# Patient Record
Sex: Male | Born: 1990 | Race: Black or African American | Hispanic: No | Marital: Single | Smoking: Never smoker
Health system: Southern US, Community
[De-identification: ages and names within clinical notes are randomized; demographics above are authoritative.]

## PROBLEM LIST (undated history)

## (undated) HISTORY — PX: ORIF PELVIC FRACTURE: SUR948

---

## 2014-06-08 ENCOUNTER — Emergency Department (HOSPITAL_COMMUNITY)
Admission: EM | Admit: 2014-06-08 | Discharge: 2014-06-08 | Disposition: A | Discharge status: Home / Self Care | Payer: Self-pay | Attending: Emergency Medicine | Admitting: Emergency Medicine

## 2014-06-08 ENCOUNTER — Encounter (HOSPITAL_COMMUNITY): Payer: Self-pay | Admitting: Emergency Medicine

## 2014-06-08 ENCOUNTER — Emergency Department (HOSPITAL_COMMUNITY): Payer: Self-pay

## 2014-06-08 DIAGNOSIS — R209 Unspecified disturbances of skin sensation: Secondary | ICD-10-CM | POA: Insufficient documentation

## 2014-06-08 DIAGNOSIS — M5416 Radiculopathy, lumbar region: Secondary | ICD-10-CM

## 2014-06-08 DIAGNOSIS — F17200 Nicotine dependence, unspecified, uncomplicated: Secondary | ICD-10-CM | POA: Insufficient documentation

## 2014-06-08 DIAGNOSIS — Z87828 Personal history of other (healed) physical injury and trauma: Secondary | ICD-10-CM | POA: Insufficient documentation

## 2014-06-08 DIAGNOSIS — IMO0002 Reserved for concepts with insufficient information to code with codable children: Secondary | ICD-10-CM | POA: Insufficient documentation

## 2014-06-08 DIAGNOSIS — M549 Dorsalgia, unspecified: Secondary | ICD-10-CM | POA: Insufficient documentation

## 2014-06-08 MED ORDER — HYDROMORPHONE HCL 2 MG/ML IJ SOLN
2.0000 mg | Freq: Once | INTRAMUSCULAR | Status: AC
  Administered 2014-06-08: 2 mg via INTRAMUSCULAR
  Filled 2014-06-08: qty 1

## 2014-06-08 MED ORDER — HYDROCODONE-ACETAMINOPHEN 5-325 MG PO TABS: 2.0000 | ORAL_TABLET | ORAL | Status: DC | PRN

## 2014-06-08 MED ORDER — ONDANSETRON 8 MG PO TBDP
8.0000 mg | ORAL_TABLET | Freq: Once | ORAL | Status: AC
  Administered 2014-06-08: 8 mg via ORAL
  Filled 2014-06-08: qty 1

## 2014-06-08 MED ORDER — PREDNISONE 20 MG PO TABS: ORAL_TABLET | ORAL | Status: DC
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
## 2014-06-08 NOTE — Discharge Instructions (Signed)
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
## 2014-06-08 NOTE — ED Notes (Signed)
Patient transported to X-ray 

## 2020-04-01 ENCOUNTER — Other Ambulatory Visit: Payer: Self-pay

## 2020-04-01 ENCOUNTER — Inpatient Hospital Stay (HOSPITAL_COMMUNITY): Payer: PRIVATE HEALTH INSURANCE | Admitting: Anesthesiology

## 2020-04-01 ENCOUNTER — Inpatient Hospital Stay (HOSPITAL_COMMUNITY): Payer: PRIVATE HEALTH INSURANCE

## 2020-04-01 ENCOUNTER — Encounter (HOSPITAL_COMMUNITY): Admission: EM | Disposition: A | Discharge status: Rehabilitation Facility | Payer: Self-pay | Source: Home / Self Care

## 2020-04-01 ENCOUNTER — Emergency Department (HOSPITAL_COMMUNITY): Payer: PRIVATE HEALTH INSURANCE

## 2020-04-01 ENCOUNTER — Encounter (HOSPITAL_COMMUNITY): Payer: Self-pay | Admitting: Emergency Medicine

## 2020-04-01 ENCOUNTER — Inpatient Hospital Stay (HOSPITAL_COMMUNITY)
Admission: EM | Admit: 2020-04-01 | Discharge: 2020-04-07 | DRG: 908 | Disposition: A | Discharge status: Rehabilitation Facility | Payer: PRIVATE HEALTH INSURANCE | Attending: Surgery | Admitting: Surgery

## 2020-04-01 DIAGNOSIS — S32811D Multiple fractures of pelvis with unstable disruption of pelvic ring, subsequent encounter for fracture with routine healing: Secondary | ICD-10-CM | POA: Diagnosis not present

## 2020-04-01 DIAGNOSIS — M7989 Other specified soft tissue disorders: Secondary | ICD-10-CM | POA: Diagnosis not present

## 2020-04-01 DIAGNOSIS — F17210 Nicotine dependence, cigarettes, uncomplicated: Secondary | ICD-10-CM | POA: Diagnosis present

## 2020-04-01 DIAGNOSIS — R202 Paresthesia of skin: Secondary | ICD-10-CM | POA: Diagnosis present

## 2020-04-01 DIAGNOSIS — M25571 Pain in right ankle and joints of right foot: Secondary | ICD-10-CM | POA: Diagnosis present

## 2020-04-01 DIAGNOSIS — R7303 Prediabetes: Secondary | ICD-10-CM | POA: Diagnosis present

## 2020-04-01 DIAGNOSIS — E559 Vitamin D deficiency, unspecified: Secondary | ICD-10-CM | POA: Diagnosis present

## 2020-04-01 DIAGNOSIS — S32810A Multiple fractures of pelvis with stable disruption of pelvic ring, initial encounter for closed fracture: Secondary | ICD-10-CM

## 2020-04-01 DIAGNOSIS — Z23 Encounter for immunization: Secondary | ICD-10-CM

## 2020-04-01 DIAGNOSIS — T148XXA Other injury of unspecified body region, initial encounter: Secondary | ICD-10-CM

## 2020-04-01 DIAGNOSIS — S329XXA Fracture of unspecified parts of lumbosacral spine and pelvis, initial encounter for closed fracture: Secondary | ICD-10-CM | POA: Diagnosis present

## 2020-04-01 DIAGNOSIS — S32810S Multiple fractures of pelvis with stable disruption of pelvic ring, sequela: Secondary | ICD-10-CM | POA: Diagnosis not present

## 2020-04-01 DIAGNOSIS — Z20822 Contact with and (suspected) exposure to covid-19: Secondary | ICD-10-CM | POA: Diagnosis present

## 2020-04-01 DIAGNOSIS — E8889 Other specified metabolic disorders: Secondary | ICD-10-CM | POA: Diagnosis present

## 2020-04-01 DIAGNOSIS — S52371A Galeazzi's fracture of right radius, initial encounter for closed fracture: Secondary | ICD-10-CM | POA: Diagnosis present

## 2020-04-01 DIAGNOSIS — M21931 Unspecified acquired deformity of right forearm: Secondary | ICD-10-CM

## 2020-04-01 DIAGNOSIS — T1490XA Injury, unspecified, initial encounter: Secondary | ICD-10-CM | POA: Diagnosis present

## 2020-04-01 DIAGNOSIS — S52591A Other fractures of lower end of right radius, initial encounter for closed fracture: Secondary | ICD-10-CM | POA: Diagnosis present

## 2020-04-01 DIAGNOSIS — K4090 Unilateral inguinal hernia, without obstruction or gangrene, not specified as recurrent: Secondary | ICD-10-CM | POA: Diagnosis present

## 2020-04-01 DIAGNOSIS — E739 Lactose intolerance, unspecified: Secondary | ICD-10-CM | POA: Diagnosis present

## 2020-04-01 DIAGNOSIS — S32810D Multiple fractures of pelvis with stable disruption of pelvic ring, subsequent encounter for fracture with routine healing: Secondary | ICD-10-CM | POA: Diagnosis not present

## 2020-04-01 DIAGNOSIS — Z6841 Body Mass Index (BMI) 40.0 and over, adult: Secondary | ICD-10-CM | POA: Diagnosis not present

## 2020-04-01 DIAGNOSIS — R109 Unspecified abdominal pain: Secondary | ICD-10-CM | POA: Diagnosis present

## 2020-04-01 DIAGNOSIS — R52 Pain, unspecified: Secondary | ICD-10-CM

## 2020-04-01 DIAGNOSIS — T07XXXA Unspecified multiple injuries, initial encounter: Secondary | ICD-10-CM | POA: Diagnosis present

## 2020-04-01 DIAGNOSIS — R609 Edema, unspecified: Secondary | ICD-10-CM | POA: Diagnosis not present

## 2020-04-01 DIAGNOSIS — D62 Acute posthemorrhagic anemia: Secondary | ICD-10-CM | POA: Diagnosis present

## 2020-04-01 DIAGNOSIS — M25562 Pain in left knee: Secondary | ICD-10-CM | POA: Diagnosis present

## 2020-04-01 DIAGNOSIS — S62101A Fracture of unspecified carpal bone, right wrist, initial encounter for closed fracture: Secondary | ICD-10-CM

## 2020-04-01 DIAGNOSIS — Q899 Congenital malformation, unspecified: Secondary | ICD-10-CM | POA: Diagnosis not present

## 2020-04-01 DIAGNOSIS — R200 Anesthesia of skin: Secondary | ICD-10-CM | POA: Diagnosis present

## 2020-04-01 DIAGNOSIS — M542 Cervicalgia: Secondary | ICD-10-CM

## 2020-04-01 DIAGNOSIS — R739 Hyperglycemia, unspecified: Secondary | ICD-10-CM | POA: Diagnosis present

## 2020-04-01 DIAGNOSIS — Z419 Encounter for procedure for purposes other than remedying health state, unspecified: Secondary | ICD-10-CM

## 2020-04-01 HISTORY — PX: ORIF WRIST FRACTURE: SHX2133

## 2020-04-01 HISTORY — PX: ORIF PELVIC FRACTURE: SHX2128

## 2020-04-01 LAB — TYPE AND SCREEN
ABO/RH(D): A POS
Antibody Screen: NEGATIVE

## 2020-04-01 LAB — CBC WITH DIFFERENTIAL/PLATELET
Abs Immature Granulocytes: 0.26 10*3/uL — ABNORMAL HIGH (ref 0.00–0.07)
Basophils Absolute: 0 10*3/uL (ref 0.0–0.1)
Basophils Relative: 0 %
Eosinophils Absolute: 0.1 10*3/uL (ref 0.0–0.5)
Eosinophils Relative: 1 %
HCT: 42.9 % (ref 39.0–52.0)
Hemoglobin: 13 g/dL (ref 13.0–17.0)
Immature Granulocytes: 2 %
Lymphocytes Relative: 25 %
Lymphs Abs: 3.1 10*3/uL (ref 0.7–4.0)
MCH: 25.5 pg — ABNORMAL LOW (ref 26.0–34.0)
MCHC: 30.3 g/dL (ref 30.0–36.0)
MCV: 84.1 fL (ref 80.0–100.0)
Monocytes Absolute: 0.9 10*3/uL (ref 0.1–1.0)
Monocytes Relative: 7 %
Neutro Abs: 8.1 10*3/uL — ABNORMAL HIGH (ref 1.7–7.7)
Neutrophils Relative %: 65 %
Platelets: 258 10*3/uL (ref 150–400)
RBC: 5.1 MIL/uL (ref 4.22–5.81)
RDW: 14.8 % (ref 11.5–15.5)
WBC: 12.4 10*3/uL — ABNORMAL HIGH (ref 4.0–10.5)
nRBC: 0 % (ref 0.0–0.2)

## 2020-04-01 LAB — COMPREHENSIVE METABOLIC PANEL
ALT: 44 U/L (ref 0–44)
AST: 77 U/L — ABNORMAL HIGH (ref 15–41)
Albumin: 3.7 g/dL (ref 3.5–5.0)
Alkaline Phosphatase: 62 U/L (ref 38–126)
Anion gap: 10 (ref 5–15)
BUN: 7 mg/dL (ref 6–20)
CO2: 22 mmol/L (ref 22–32)
Calcium: 9.3 mg/dL (ref 8.9–10.3)
Chloride: 108 mmol/L (ref 98–111)
Creatinine, Ser: 1.06 mg/dL (ref 0.61–1.24)
GFR calc Af Amer: >60 mL/min (ref >60)
GFR calc non Af Amer: >60 mL/min (ref >60)
Glucose, Bld: 131 mg/dL — ABNORMAL HIGH (ref 70–99)
Potassium: 3.7 mmol/L (ref 3.5–5.1)
Sodium: 140 mmol/L (ref 135–145)
Total Bilirubin: 0.8 mg/dL (ref 0.3–1.2)
Total Protein: 7.3 g/dL (ref 6.5–8.1)

## 2020-04-01 LAB — MRSA PCR SCREENING: MRSA by PCR: NEGATIVE

## 2020-04-01 LAB — URINALYSIS, ROUTINE W REFLEX MICROSCOPIC
Bilirubin Urine: NEGATIVE
Glucose, UA: NEGATIVE mg/dL
Hgb urine dipstick: NEGATIVE
Ketones, ur: NEGATIVE mg/dL
Leukocytes,Ua: NEGATIVE
Nitrite: NEGATIVE
Protein, ur: 100 mg/dL — AB
Specific Gravity, Urine: 1.027 (ref 1.005–1.030)
pH: 6 (ref 5.0–8.0)

## 2020-04-01 LAB — I-STAT CHEM 8, ED
BUN: 9 mg/dL (ref 6–20)
Calcium, Ion: 1.23 mmol/L (ref 1.15–1.40)
Chloride: 107 mmol/L (ref 98–111)
Creatinine, Ser: 1 mg/dL (ref 0.61–1.24)
Glucose, Bld: 122 mg/dL — ABNORMAL HIGH (ref 70–99)
HCT: 40 % (ref 39.0–52.0)
Hemoglobin: 13.6 g/dL (ref 13.0–17.0)
Potassium: 3.8 mmol/L (ref 3.5–5.1)
Sodium: 142 mmol/L (ref 135–145)
TCO2: 22 mmol/L (ref 22–32)

## 2020-04-01 LAB — SARS CORONAVIRUS 2 BY RT PCR (HOSPITAL ORDER, PERFORMED IN ~~LOC~~ HOSPITAL LAB): SARS Coronavirus 2: NEGATIVE

## 2020-04-01 SURGERY — OPEN REDUCTION INTERNAL FIXATION (ORIF) WRIST FRACTURE
Anesthesia: General | Site: Wrist | Laterality: Right

## 2020-04-01 MED ORDER — PROPOFOL 10 MG/ML IV BOLUS
INTRAVENOUS | Status: DC | PRN
  Administered 2020-04-01: 300 mg via INTRAVENOUS
  Administered 2020-04-01: 50 mg via INTRAVENOUS

## 2020-04-01 MED ORDER — ONDANSETRON 4 MG PO TBDP: 4.0000 mg | ORAL_TABLET | Freq: Four times a day (QID) | ORAL | Status: DC | PRN

## 2020-04-01 MED ORDER — IOHEXOL 350 MG/ML SOLN
100.0000 mL | Freq: Once | INTRAVENOUS | Status: AC | PRN
  Administered 2020-04-01: 100 mL via INTRAVENOUS

## 2020-04-01 MED ORDER — CHLORHEXIDINE GLUCONATE CLOTH 2 % EX PADS
6.0000 | MEDICATED_PAD | Freq: Every day | CUTANEOUS | Status: DC
  Administered 2020-04-01 – 2020-04-06 (×6): 6 via TOPICAL

## 2020-04-01 MED ORDER — DIPHENHYDRAMINE HCL 12.5 MG/5ML PO ELIX
12.5000 mg | ORAL_SOLUTION | Freq: Four times a day (QID) | ORAL | Status: DC | PRN
  Filled 2020-04-01: qty 5

## 2020-04-01 MED ORDER — MIDAZOLAM HCL 5 MG/5ML IJ SOLN
INTRAMUSCULAR | Status: AC | PRN
  Administered 2020-04-01 (×2): 0.5 mg via INTRAVENOUS

## 2020-04-01 MED ORDER — SODIUM CHLORIDE 0.9 % IV BOLUS: 1000.0000 mL | Freq: Once | INTRAVENOUS | Status: DC

## 2020-04-01 MED ORDER — MIDAZOLAM HCL 5 MG/5ML IJ SOLN
INTRAMUSCULAR | Status: DC | PRN
  Administered 2020-04-01: 2 mg via INTRAVENOUS

## 2020-04-01 MED ORDER — SODIUM CHLORIDE 0.9% FLUSH: 9.0000 mL | INTRAVENOUS | Status: DC | PRN

## 2020-04-01 MED ORDER — VANCOMYCIN HCL 1000 MG IV SOLR
INTRAVENOUS | Status: DC | PRN
  Administered 2020-04-01: 1000 mg via TOPICAL

## 2020-04-01 MED ORDER — DIPHENHYDRAMINE HCL 50 MG/ML IJ SOLN: 12.5000 mg | Freq: Four times a day (QID) | INTRAMUSCULAR | Status: DC | PRN

## 2020-04-01 MED ORDER — ROCURONIUM BROMIDE 100 MG/10ML IV SOLN
INTRAVENOUS | Status: DC | PRN
  Administered 2020-04-01: 60 mg via INTRAVENOUS
  Administered 2020-04-01: 40 mg via INTRAVENOUS
  Administered 2020-04-01: 50 mg via INTRAVENOUS

## 2020-04-01 MED ORDER — MIDAZOLAM HCL 2 MG/2ML IJ SOLN
INTRAMUSCULAR | Status: AC
  Filled 2020-04-01: qty 2

## 2020-04-01 MED ORDER — MORPHINE SULFATE (PF) 2 MG/ML IV SOLN
2.0000 mg | INTRAVENOUS | Status: DC | PRN
  Administered 2020-04-01 – 2020-04-02 (×5): 4 mg via INTRAVENOUS
  Administered 2020-04-02: 2 mg via INTRAVENOUS
  Administered 2020-04-02 – 2020-04-03 (×4): 4 mg via INTRAVENOUS
  Filled 2020-04-01: qty 2
  Filled 2020-04-01: qty 1
  Filled 2020-04-01 (×8): qty 2

## 2020-04-01 MED ORDER — SODIUM CHLORIDE 0.9 % IV SOLN
INTRAVENOUS | Status: AC | PRN
  Administered 2020-04-01: 100 mL/h via INTRAVENOUS

## 2020-04-01 MED ORDER — STERILE WATER FOR IRRIGATION IR SOLN
Status: DC | PRN
  Administered 2020-04-01: 1000 mL

## 2020-04-01 MED ORDER — METOPROLOL TARTRATE 5 MG/5ML IV SOLN: 5.0000 mg | Freq: Four times a day (QID) | INTRAVENOUS | Status: DC | PRN

## 2020-04-01 MED ORDER — GLYCOPYRROLATE PF 0.2 MG/ML IJ SOSY
PREFILLED_SYRINGE | INTRAMUSCULAR | Status: AC
  Filled 2020-04-01: qty 1

## 2020-04-01 MED ORDER — VANCOMYCIN HCL 1000 MG IV SOLR
INTRAVENOUS | Status: AC
  Filled 2020-04-01: qty 1000

## 2020-04-01 MED ORDER — ACETAMINOPHEN 325 MG PO TABS
650.0000 mg | ORAL_TABLET | Freq: Four times a day (QID) | ORAL | Status: DC
  Administered 2020-04-02 – 2020-04-03 (×4): 650 mg via ORAL
  Filled 2020-04-01 (×4): qty 2

## 2020-04-01 MED ORDER — SODIUM CHLORIDE (PF) 0.9 % IJ SOLN
INTRAMUSCULAR | Status: AC
  Filled 2020-04-01: qty 20

## 2020-04-01 MED ORDER — HYDROMORPHONE HCL 1 MG/ML IJ SOLN
2.0000 mg | INTRAMUSCULAR | Status: AC
  Administered 2020-04-01: 2 mg via INTRAVENOUS
  Filled 2020-04-01: qty 2

## 2020-04-01 MED ORDER — NALOXONE HCL 0.4 MG/ML IJ SOLN: 0.4000 mg | INTRAMUSCULAR | Status: DC | PRN

## 2020-04-01 MED ORDER — SUFENTANIL CITRATE 50 MCG/ML IV SOLN
INTRAVENOUS | Status: DC | PRN
  Administered 2020-04-01 (×3): 10 ug via INTRAVENOUS
  Administered 2020-04-01: 20 ug via INTRAVENOUS
  Administered 2020-04-01: 10 ug via INTRAVENOUS
  Administered 2020-04-01: 20 ug via INTRAVENOUS
  Administered 2020-04-01 – 2020-04-02 (×4): 10 ug via INTRAVENOUS

## 2020-04-01 MED ORDER — ONDANSETRON HCL 4 MG/2ML IJ SOLN
4.0000 mg | Freq: Four times a day (QID) | INTRAMUSCULAR | Status: DC | PRN
  Filled 2020-04-01: qty 2

## 2020-04-01 MED ORDER — DEXTROSE 5 % IV SOLN
3.0000 g | Freq: Three times a day (TID) | INTRAVENOUS | Status: DC
  Administered 2020-04-01: 3 g via INTRAVENOUS
  Filled 2020-04-01 (×4): qty 3000

## 2020-04-01 MED ORDER — CEFAZOLIN SODIUM 1 G IJ SOLR
INTRAMUSCULAR | Status: AC
  Filled 2020-04-01: qty 30

## 2020-04-01 MED ORDER — LACTATED RINGERS IV SOLN: INTRAVENOUS | Status: DC | PRN

## 2020-04-01 MED ORDER — FENTANYL CITRATE (PF) 100 MCG/2ML IJ SOLN
INTRAMUSCULAR | Status: AC | PRN
  Administered 2020-04-01: 100 ug via INTRAVENOUS

## 2020-04-01 MED ORDER — GLYCOPYRROLATE 0.2 MG/ML IJ SOLN
INTRAMUSCULAR | Status: DC | PRN
  Administered 2020-04-01: .2 mg via INTRAVENOUS

## 2020-04-01 MED ORDER — SUCCINYLCHOLINE CHLORIDE 20 MG/ML IJ SOLN
INTRAMUSCULAR | Status: DC | PRN
  Administered 2020-04-01: 160 mg via INTRAVENOUS

## 2020-04-01 MED ORDER — BISACODYL 10 MG RE SUPP: 10.0000 mg | Freq: Every day | RECTAL | Status: DC | PRN

## 2020-04-01 MED ORDER — SUCCINYLCHOLINE CHLORIDE 200 MG/10ML IV SOSY
PREFILLED_SYRINGE | INTRAVENOUS | Status: AC
  Filled 2020-04-01: qty 10

## 2020-04-01 MED ORDER — ROCURONIUM BROMIDE 10 MG/ML (PF) SYRINGE
PREFILLED_SYRINGE | INTRAVENOUS | Status: AC
  Filled 2020-04-01: qty 20

## 2020-04-01 MED ORDER — PANTOPRAZOLE SODIUM 40 MG PO TBEC
40.0000 mg | DELAYED_RELEASE_TABLET | Freq: Every day | ORAL | Status: DC
  Administered 2020-04-02 – 2020-04-07 (×6): 40 mg via ORAL
  Filled 2020-04-01 (×6): qty 1

## 2020-04-01 MED ORDER — VANCOMYCIN HCL 500 MG IV SOLR
INTRAVENOUS | Status: AC
  Filled 2020-04-01: qty 500

## 2020-04-01 MED ORDER — ONDANSETRON HCL 4 MG/2ML IJ SOLN: 4.0000 mg | Freq: Once | INTRAMUSCULAR | Status: AC

## 2020-04-01 MED ORDER — 0.9 % SODIUM CHLORIDE (POUR BTL) OPTIME
TOPICAL | Status: DC | PRN
  Administered 2020-04-01 (×2): 1000 mL

## 2020-04-01 MED ORDER — PNEUMOCOCCAL VAC POLYVALENT 25 MCG/0.5ML IJ INJ: 0.5000 mL | INJECTION | INTRAMUSCULAR | Status: DC

## 2020-04-01 MED ORDER — SUFENTANIL CITRATE 50 MCG/ML IV SOLN
INTRAVENOUS | Status: AC
  Filled 2020-04-01: qty 1

## 2020-04-01 MED ORDER — LIDOCAINE HCL 1 % IJ SOLN
20.0000 mL | Freq: Once | INTRAMUSCULAR | Status: DC
  Filled 2020-04-01: qty 20

## 2020-04-01 MED ORDER — TETANUS-DIPHTH-ACELL PERTUSSIS 5-2.5-18.5 LF-MCG/0.5 IM SUSP
0.5000 mL | Freq: Once | INTRAMUSCULAR | Status: AC
  Administered 2020-04-01: 0.5 mL via INTRAMUSCULAR
  Filled 2020-04-01: qty 0.5

## 2020-04-01 MED ORDER — DEXAMETHASONE SODIUM PHOSPHATE 10 MG/ML IJ SOLN
INTRAMUSCULAR | Status: AC
  Filled 2020-04-01: qty 1

## 2020-04-01 MED ORDER — ONDANSETRON HCL 4 MG/2ML IJ SOLN
INTRAMUSCULAR | Status: AC
  Filled 2020-04-01: qty 2

## 2020-04-01 MED ORDER — LIDOCAINE HCL (CARDIAC) PF 100 MG/5ML IV SOSY
PREFILLED_SYRINGE | INTRAVENOUS | Status: DC | PRN
  Administered 2020-04-01: 100 mg via INTRATRACHEAL

## 2020-04-01 MED ORDER — MIDAZOLAM HCL 2 MG/2ML IJ SOLN: 0.5000 mg | Freq: Once | INTRAMUSCULAR | Status: DC

## 2020-04-01 MED ORDER — PANTOPRAZOLE SODIUM 40 MG IV SOLR: 40.0000 mg | Freq: Every day | INTRAVENOUS | Status: DC

## 2020-04-01 MED ORDER — PROPOFOL 10 MG/ML IV BOLUS
INTRAVENOUS | Status: AC
  Filled 2020-04-01: qty 20

## 2020-04-01 MED ORDER — DEXAMETHASONE SODIUM PHOSPHATE 10 MG/ML IJ SOLN
INTRAMUSCULAR | Status: DC | PRN
  Administered 2020-04-01: 10 mg via INTRAVENOUS

## 2020-04-01 MED ORDER — DEXTROSE 5 % IV SOLN
3.0000 g | Freq: Three times a day (TID) | INTRAVENOUS | Status: DC
  Filled 2020-04-01 (×3): qty 3000

## 2020-04-01 MED ORDER — DOCUSATE SODIUM 100 MG PO CAPS
100.0000 mg | ORAL_CAPSULE | Freq: Two times a day (BID) | ORAL | Status: DC
  Administered 2020-04-02 – 2020-04-07 (×11): 100 mg via ORAL
  Filled 2020-04-01 (×11): qty 1

## 2020-04-01 MED ORDER — OXYCODONE HCL 5 MG PO TABS
5.0000 mg | ORAL_TABLET | ORAL | Status: DC | PRN
  Administered 2020-04-02 – 2020-04-04 (×8): 10 mg via ORAL
  Administered 2020-04-04: 5 mg via ORAL
  Administered 2020-04-04 – 2020-04-07 (×9): 10 mg via ORAL
  Filled 2020-04-01 (×21): qty 2

## 2020-04-01 MED ORDER — HYDROMORPHONE 1 MG/ML IV SOLN: INTRAVENOUS | Status: DC

## 2020-04-01 MED ORDER — HYDROMORPHONE HCL 1 MG/ML IJ SOLN: 1.0000 mg | Freq: Once | INTRAMUSCULAR | Status: DC

## 2020-04-01 MED ORDER — HYDROMORPHONE HCL 1 MG/ML IJ SOLN
INTRAMUSCULAR | Status: AC
  Filled 2020-04-01: qty 1

## 2020-04-01 MED ORDER — ONDANSETRON HCL 4 MG/2ML IJ SOLN
INTRAMUSCULAR | Status: AC
  Administered 2020-04-01: 4 mg via INTRAVENOUS
  Filled 2020-04-01: qty 2

## 2020-04-01 MED ORDER — ACETAMINOPHEN 10 MG/ML IV SOLN
INTRAVENOUS | Status: DC | PRN
  Administered 2020-04-01: 1000 mg via INTRAVENOUS

## 2020-04-01 MED ORDER — FENTANYL CITRATE (PF) 100 MCG/2ML IJ SOLN
INTRAMUSCULAR | Status: AC
  Filled 2020-04-01: qty 2

## 2020-04-01 MED ORDER — HYDROMORPHONE HCL 1 MG/ML IJ SOLN
INTRAMUSCULAR | Status: AC | PRN
  Administered 2020-04-01 (×2): 1 mg via INTRAVENOUS

## 2020-04-01 MED ORDER — LACTATED RINGERS IV SOLN: INTRAVENOUS | Status: DC

## 2020-04-01 MED ORDER — CEFAZOLIN SODIUM-DEXTROSE 2-3 GM-%(50ML) IV SOLR
INTRAVENOUS | Status: DC | PRN
  Administered 2020-04-01: 3 g via INTRAVENOUS

## 2020-04-01 MED ORDER — ALBUMIN HUMAN 5 % IV SOLN: INTRAVENOUS | Status: DC | PRN

## 2020-04-01 SURGICAL SUPPLY — 129 items
BIT DRILL 2.0 LNG QUCK RELEASE (BIT) ×2 IMPLANT
BIT DRILL 2.8 QUICK RELEASE (BIT) ×2 IMPLANT
BIT DRILL 5.6 (BIT) ×2 IMPLANT
BIT DRILL AO MATTA 2.5MX230M (BIT) ×2 IMPLANT
BLADE CLIPPER SURG (BLADE) ×3 IMPLANT
BNDG CMPR 9X4 STRL LF SNTH (GAUZE/BANDAGES/DRESSINGS)
BNDG ELASTIC 3X5.8 VLCR STR LF (GAUZE/BANDAGES/DRESSINGS) ×3 IMPLANT
BNDG ELASTIC 4X5.8 VLCR STR LF (GAUZE/BANDAGES/DRESSINGS) ×6 IMPLANT
BNDG ESMARK 4X9 LF (GAUZE/BANDAGES/DRESSINGS) IMPLANT
BNDG GAUZE ELAST 4 BULKY (GAUZE/BANDAGES/DRESSINGS) ×3 IMPLANT
BNDG PLASTER X FAST 4X5 WHT LF (CAST SUPPLIES) ×9 IMPLANT
BNDG PLSTR 5X4 XFST ST WHT LF (CAST SUPPLIES) ×6
BRUSH SCRUB EZ PLAIN DRY (MISCELLANEOUS) ×6 IMPLANT
CANISTER SUCT 3000ML PPV (MISCELLANEOUS) ×3 IMPLANT
CANISTER WOUND CARE 500ML ATS (WOUND CARE) ×3 IMPLANT
CORD BIPOLAR FORCEPS 12FT (ELECTRODE) ×3 IMPLANT
COVER BACK TABLE 60X90IN (DRAPES) ×3 IMPLANT
COVER SURGICAL LIGHT HANDLE (MISCELLANEOUS) ×12 IMPLANT
COVER WAND RF STERILE (DRAPES) ×3 IMPLANT
DRAPE C-ARM 42X72 X-RAY (DRAPES) ×12 IMPLANT
DRAPE C-ARM MINI 42X72 WSTRAPS (DRAPES) ×3 IMPLANT
DRAPE C-ARMOR (DRAPES) ×3 IMPLANT
DRAPE HALF SHEET 40X57 (DRAPES) ×12 IMPLANT
DRAPE IMP U-DRAPE 54X76 (DRAPES) ×6 IMPLANT
DRAPE LAPAROSCOPIC ABDOMINAL (DRAPES) ×3 IMPLANT
DRAPE LAPAROTOMY 100X72X124 (DRAPES) IMPLANT
DRAPE LAPAROTOMY TRNSV 102X78 (DRAPES) IMPLANT
DRAPE OEC MINIVIEW 54X84 (DRAPES) IMPLANT
DRAPE ORTHO SPLIT 77X108 STRL (DRAPES) ×6
DRAPE SURG 17X23 STRL (DRAPES) ×6 IMPLANT
DRAPE SURG ORHT 6 SPLT 77X108 (DRAPES) ×4 IMPLANT
DRAPE U-SHAPE 47X51 STRL (DRAPES) ×3 IMPLANT
DRESSING PREVENA PLUS CUSTOM (GAUZE/BANDAGES/DRESSINGS) ×2 IMPLANT
DRILL 2.0 LNG QUICK RELEASE (BIT) ×3
DRILL 2.8 QUICK RELEASE (BIT) ×3
DRILL 5.6 (BIT) ×3
DRILL BIT AO MATTA 2.5MX230M (BIT) ×3
DRSG ADAPTIC 3X8 NADH LF (GAUZE/BANDAGES/DRESSINGS) ×3 IMPLANT
DRSG MEPILEX BORDER 4X4 (GAUZE/BANDAGES/DRESSINGS) ×3 IMPLANT
DRSG PREVENA PLUS CUSTOM (GAUZE/BANDAGES/DRESSINGS) ×3
ELECT BLADE 4.0 EZ CLEAN MEGAD (MISCELLANEOUS) ×6
ELECT BLADE 6.5 EXT (BLADE) ×3 IMPLANT
ELECT CAUTERY BLADE 6.4 (BLADE) ×3 IMPLANT
ELECT REM PT RETURN 9FT ADLT (ELECTROSURGICAL) ×3
ELECTRODE BLDE 4.0 EZ CLN MEGD (MISCELLANEOUS) ×4 IMPLANT
ELECTRODE REM PT RTRN 9FT ADLT (ELECTROSURGICAL) ×2 IMPLANT
EXTRACTOR EASY OUT 1.5 (BIT) ×3
EXTRACTOR SURG EASY OUT 1.5 (BIT) ×2 IMPLANT
GAUZE SPONGE 4X4 12PLY STRL (GAUZE/BANDAGES/DRESSINGS) ×3 IMPLANT
GLOVE BIO SURGEON STRL SZ7.5 (GLOVE) ×6 IMPLANT
GLOVE BIO SURGEON STRL SZ8 (GLOVE) ×9 IMPLANT
GLOVE BIOGEL PI IND STRL 7.5 (GLOVE) ×4 IMPLANT
GLOVE BIOGEL PI IND STRL 8 (GLOVE) ×6 IMPLANT
GLOVE BIOGEL PI INDICATOR 7.5 (GLOVE) ×2
GLOVE BIOGEL PI INDICATOR 8 (GLOVE) ×3
GLOVE SURG SS PI 6.5 STRL IVOR (GLOVE) ×18 IMPLANT
GLOVE SURG SS PI 8.0 STRL IVOR (GLOVE) ×3 IMPLANT
GOWN STRL REIN XL XLG (GOWN DISPOSABLE) ×6 IMPLANT
GOWN STRL REUS W/ TWL LRG LVL3 (GOWN DISPOSABLE) ×4 IMPLANT
GOWN STRL REUS W/ TWL XL LVL3 (GOWN DISPOSABLE) ×4 IMPLANT
GOWN STRL REUS W/TWL LRG LVL3 (GOWN DISPOSABLE) ×6
GOWN STRL REUS W/TWL XL LVL3 (GOWN DISPOSABLE) ×6
GUIDEWIRE ORTHO 0.054X6 (WIRE) ×3 IMPLANT
GUIDEWIRE THRD ASNIS 3.2X300 (WIRE) ×9 IMPLANT
KIT BASIN OR (CUSTOM PROCEDURE TRAY) ×9 IMPLANT
KIT DRSG PREVENA PLUS 7DAY 125 (MISCELLANEOUS) ×3 IMPLANT
KIT TURNOVER KIT B (KITS) ×3 IMPLANT
MARKER SKIN DUAL TIP RULER LAB (MISCELLANEOUS) ×6 IMPLANT
NS IRRIG 1000ML POUR BTL (IV SOLUTION) ×12 IMPLANT
PACK ORTHO EXTREMITY (CUSTOM PROCEDURE TRAY) ×6 IMPLANT
PAD ARMBOARD 7.5X6 YLW CONV (MISCELLANEOUS) ×9 IMPLANT
PAD CAST 3X4 CTTN HI CHSV (CAST SUPPLIES) ×4 IMPLANT
PAD CAST 4YDX4 CTTN HI CHSV (CAST SUPPLIES) ×4 IMPLANT
PADDING CAST COTTON 3X4 STRL (CAST SUPPLIES) ×6
PADDING CAST COTTON 4X4 STRL (CAST SUPPLIES) ×6
PLATE ACULOC 2 EXTENTION (Plate) ×3 IMPLANT
PLATE ACULOC 2 STANDARD LG R (Plate) ×3 IMPLANT
PLATE ACULOC 2 STD LG R (Plate) ×2 IMPLANT
PLATE PROXIMAL ACULOC 2 WD RT (Plate) ×3 IMPLANT
PLATE SYMPHYSIS 92.5M 6H (Plate) ×3 IMPLANT
RETRACTOR ONETRAX LX 135X30 (MISCELLANEOUS) ×3 IMPLANT
RETRACTOR YANK SUCT EIGR SABER (INSTRUMENTS) ×3 IMPLANT
SCREW BN FT 16X2.3XLCK HEX CRT (Screw) ×2 IMPLANT
SCREW CANN ASNIS 8X150MM (Screw) ×3 IMPLANT
SCREW CANN PT SD 8X175X25 (Screw) ×3 IMPLANT
SCREW CANN THRD 5.5MMX55MM (Screw) ×3 IMPLANT
SCREW CORT 48X3.5XST NS LF (Screw) ×2 IMPLANT
SCREW CORT FT 18X2.3XLCK HEX (Screw) ×6 IMPLANT
SCREW CORT FT 20X2.3XLCK HEX (Screw) ×4 IMPLANT
SCREW CORTEX ST MATTA 3.5X30MM (Screw) ×9 IMPLANT
SCREW CORTEX ST MATTA 3.5X34MM (Screw) ×6 IMPLANT
SCREW CORTEX ST MATTA 3.5X55MM (Screw) ×3 IMPLANT
SCREW CORTEX ST MATTA 3.5X75MM (Screw) ×3 IMPLANT
SCREW CORTICAL 3.5X48MM (Screw) ×3 IMPLANT
SCREW CORTICAL LOCKING 2.3X16M (Screw) ×3 IMPLANT
SCREW CORTICAL LOCKING 2.3X18M (Screw) ×9 IMPLANT
SCREW CORTICAL LOCKING 2.3X20M (Screw) ×6 IMPLANT
SCREW HEXALOBE LOCKING 3.5X14M (Screw) ×6 IMPLANT
SCREW HEXALOBE NON-LOCK 3.5X14 (Screw) ×9 IMPLANT
SCREW HEXALOBE NON-LOCK 3.5X16 (Screw) ×6 IMPLANT
SCREW VA LKG 2.3X16MM (Screw) ×3 IMPLANT
SCREW VA LKG 2.3X20MM (Screw) ×3 IMPLANT
SLING ARM FOAM STRAP XLG (SOFTGOODS) ×3 IMPLANT
SOL PREP POV-IOD 4OZ 10% (MISCELLANEOUS) ×6 IMPLANT
SOL PREP PROV IODINE SCRUB 4OZ (MISCELLANEOUS) ×3 IMPLANT
SOLUTION BETADINE 4OZ (MISCELLANEOUS) ×3 IMPLANT
SPONGE LAP 18X18 RF (DISPOSABLE) ×9 IMPLANT
SPONGE LAP 4X18 RFD (DISPOSABLE) ×3 IMPLANT
STAPLER VISISTAT 35W (STAPLE) ×6 IMPLANT
SUCTION FRAZIER HANDLE 10FR (MISCELLANEOUS)
SUCTION TUBE FRAZIER 10FR DISP (MISCELLANEOUS) IMPLANT
SUT ETHILON 2 0 PSLX (SUTURE) ×3 IMPLANT
SUT ETHILON 3 0 PS 1 (SUTURE) ×6 IMPLANT
SUT VIC AB 0 CT1 27 (SUTURE) ×3
SUT VIC AB 0 CT1 27XBRD ANBCTR (SUTURE) ×2 IMPLANT
SUT VIC AB 1 CT1 18XCR BRD 8 (SUTURE) IMPLANT
SUT VIC AB 1 CT1 36 (SUTURE) ×6 IMPLANT
SUT VIC AB 1 CT1 8-18 (SUTURE)
SUT VIC AB 2-0 CT1 27 (SUTURE) ×6
SUT VIC AB 2-0 CT1 TAPERPNT 27 (SUTURE) ×4 IMPLANT
TOWEL GREEN STERILE (TOWEL DISPOSABLE) ×9 IMPLANT
TOWEL GREEN STERILE FF (TOWEL DISPOSABLE) ×6 IMPLANT
TUBE CONNECTING 12X1/4 (SUCTIONS) ×6 IMPLANT
TUBE EVACUATION TLS (MISCELLANEOUS) ×3 IMPLANT
UNDERPAD 30X36 HEAVY ABSORB (UNDERPADS AND DIAPERS) ×3 IMPLANT
WASHER SCREW MATTA SS 13.0X1.5 (Washer) ×6 IMPLANT
WATER STERILE IRR 1000ML POUR (IV SOLUTION) ×6 IMPLANT
WIRE TACK PLATE PL-PTACK (Wire) ×3 IMPLANT
YANKAUER SUCT BULB TIP NO VENT (SUCTIONS) ×3 IMPLANT

## 2020-04-01 NOTE — Progress Notes (Signed)
Report called to short stay at this time.  Awaiting transport. Mother and fiance at bedside.
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
## 2020-04-01 NOTE — ED Notes (Signed)
Pelvic binder placed on Ed by Dr. Bedelia Person and ER staff.
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
## 2020-04-01 NOTE — ED Notes (Signed)
Pt release and transferred to CT.

## 2020-04-01 NOTE — Progress Notes (Signed)
CT c-spine reviewed and negative for fracture. Clinical exam performed to evaluate for ligamentous injury. C-spine evaluation performed. No midline pain or TTP. Patient able to turn head left and right without midline cervical pain. Neck extension performed without midline pain, but midline pain with flexion. C-spine replaced and flex/ex films ordered.   Diamantina Monks, MD General and Trauma Surgery [facility]

## 2020-04-01 NOTE — Anesthesia Procedure Notes (Signed)
Procedure Name: Intubation Date/Time: 04/01/2020 8:15 PM Performed by: Melina Schools, CRNA Pre-anesthesia Checklist: Patient identified, Emergency Drugs available, Suction available, Patient being monitored and Timeout performed Patient Re-evaluated:Patient Re-evaluated prior to induction Oxygen Delivery Method: Circle system utilized Preoxygenation: Pre-oxygenation with 100% oxygen Induction Type: IV induction, Rapid sequence and Cricoid Pressure applied Laryngoscope Size: Glidescope and 4 Grade View: Grade II Tube type: Oral Tube size: 8.0 mm Number of attempts: 1 Airway Equipment and Method: Stylet and Video-laryngoscopy Placement Confirmation: ETT inserted through vocal cords under direct vision,  positive ETCO2 and breath sounds checked- equal and bilateral Secured at: 25 cm Tube secured with: Tape Dental Injury: Teeth and Oropharynx as per pre-operative assessment
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
## 2020-04-01 NOTE — Progress Notes (Signed)
All belongings taken with family after admitted. Pt has no belongings at bedside at time of leaving for OR
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
## 2020-04-01 NOTE — Anesthesia Preprocedure Evaluation (Addendum)
Anesthesia Evaluation  Patient identified by MRN, date of birth, ID band Patient awake    Reviewed: Allergy & Precautions, NPO status , Patient's Chart, lab work & pertinent test results  History of Anesthesia Complications Negative for: history of anesthetic complications  Airway Mallampati: II  TM Distance: >3 FB Neck ROM: Limited    Dental  (+) Dental Advisory Given, Missing   Pulmonary Current SmokerPatient did not abstain from smoking.,   Possible OSA, untested    Pulmonary exam normal        Cardiovascular negative cardio ROS   Rhythm:Regular Rate:Tachycardia     Neuro/Psych  C-spine not cleared negative psych ROS   GI/Hepatic negative GI ROS, Neg liver ROS,   Endo/Other  Morbid obesity  Renal/GU negative Renal ROS     Musculoskeletal negative musculoskeletal ROS (+)   Abdominal   Peds  Hematology negative hematology ROS (+)   Anesthesia Other Findings Covid test negative   Reproductive/Obstetrics                            Anesthesia Physical Anesthesia Plan  ASA: III  Anesthesia Plan: General   Post-op Pain Management:    Induction: Intravenous  PONV Risk Score and Plan: 3 and Treatment may vary due to age or medical condition, Ondansetron, Dexamethasone and Midazolam  Airway Management Planned: Oral ETT and Video Laryngoscope Planned  Additional Equipment: None  Intra-op Plan:   Post-operative Plan: Extubation in OR  Informed Consent: I have reviewed the patients History and Physical, chart, labs and discussed the procedure including the risks, benefits and alternatives for the proposed anesthesia with the patient or authorized representative who has indicated his/her understanding and acceptance.     Dental advisory given  Plan Discussed with: CRNA and Anesthesiologist  Anesthesia Plan Comments:        Anesthesia Quick Evaluation
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
## 2020-04-01 NOTE — ED Notes (Signed)
200 mcg Fentanyl given by EMS pta to ED.
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
## 2020-04-01 NOTE — ED Notes (Signed)
Family notified

## 2020-04-01 NOTE — ED Notes (Addendum)
Delay on ct transfer due To Dr. Reuel Boom assessment.

## 2020-04-02 ENCOUNTER — Inpatient Hospital Stay (HOSPITAL_COMMUNITY): Payer: PRIVATE HEALTH INSURANCE

## 2020-04-02 LAB — COMPREHENSIVE METABOLIC PANEL
ALT: 44 U/L (ref 0–44)
AST: 89 U/L — ABNORMAL HIGH (ref 15–41)
Albumin: 3.2 g/dL — ABNORMAL LOW (ref 3.5–5.0)
Alkaline Phosphatase: 51 U/L (ref 38–126)
Anion gap: 9 (ref 5–15)
BUN: 7 mg/dL (ref 6–20)
CO2: 22 mmol/L (ref 22–32)
Calcium: 8.6 mg/dL — ABNORMAL LOW (ref 8.9–10.3)
Chloride: 105 mmol/L (ref 98–111)
Creatinine, Ser: 1.07 mg/dL (ref 0.61–1.24)
GFR calc Af Amer: >60 mL/min (ref >60)
GFR calc non Af Amer: >60 mL/min (ref >60)
Glucose, Bld: 205 mg/dL — ABNORMAL HIGH (ref 70–99)
Potassium: 4 mmol/L (ref 3.5–5.1)
Sodium: 136 mmol/L (ref 135–145)
Total Bilirubin: 1 mg/dL (ref 0.3–1.2)
Total Protein: 6 g/dL — ABNORMAL LOW (ref 6.5–8.1)

## 2020-04-02 LAB — CBC
HCT: 35.6 % — ABNORMAL LOW (ref 39.0–52.0)
Hemoglobin: 11.2 g/dL — ABNORMAL LOW (ref 13.0–17.0)
MCH: 25.9 pg — ABNORMAL LOW (ref 26.0–34.0)
MCHC: 31.5 g/dL (ref 30.0–36.0)
MCV: 82.4 fL (ref 80.0–100.0)
Platelets: 201 10*3/uL (ref 150–400)
RBC: 4.32 MIL/uL (ref 4.22–5.81)
RDW: 14.9 % (ref 11.5–15.5)
WBC: 14.3 10*3/uL — ABNORMAL HIGH (ref 4.0–10.5)
nRBC: 0 % (ref 0.0–0.2)

## 2020-04-02 LAB — VITAMIN D 25 HYDROXY (VIT D DEFICIENCY, FRACTURES): Vit D, 25-Hydroxy: 10.27 ng/mL — ABNORMAL LOW (ref 30–100)

## 2020-04-02 LAB — ABO/RH: ABO/RH(D): A POS

## 2020-04-02 LAB — HIV ANTIBODY (ROUTINE TESTING W REFLEX): HIV Screen 4th Generation wRfx: NONREACTIVE

## 2020-04-02 MED ORDER — OXYCODONE HCL 5 MG PO TABS: 5.0000 mg | ORAL_TABLET | Freq: Once | ORAL | Status: DC | PRN

## 2020-04-02 MED ORDER — DEXMEDETOMIDINE (PRECEDEX) IN NS 20 MCG/5ML (4 MCG/ML) IV SYRINGE
PREFILLED_SYRINGE | INTRAVENOUS | Status: DC | PRN
  Administered 2020-04-02 (×5): 4 ug via INTRAVENOUS

## 2020-04-02 MED ORDER — VITAMIN D 25 MCG (1000 UNIT) PO TABS
2000.0000 [IU] | ORAL_TABLET | Freq: Two times a day (BID) | ORAL | Status: DC
  Administered 2020-04-02 – 2020-04-07 (×11): 2000 [IU] via ORAL
  Filled 2020-04-02 (×11): qty 2

## 2020-04-02 MED ORDER — PROMETHAZINE HCL 25 MG/ML IJ SOLN: 6.2500 mg | INTRAMUSCULAR | Status: DC | PRN

## 2020-04-02 MED ORDER — FENTANYL CITRATE (PF) 100 MCG/2ML IJ SOLN
25.0000 ug | INTRAMUSCULAR | Status: DC | PRN
  Administered 2020-04-02 (×2): 25 ug via INTRAVENOUS
  Administered 2020-04-02: 50 ug via INTRAVENOUS

## 2020-04-02 MED ORDER — ASCORBIC ACID 500 MG PO TABS
1000.0000 mg | ORAL_TABLET | Freq: Every day | ORAL | Status: DC
  Administered 2020-04-02 – 2020-04-07 (×6): 1000 mg via ORAL
  Filled 2020-04-02 (×6): qty 2

## 2020-04-02 MED ORDER — CEFAZOLIN SODIUM-DEXTROSE 2-4 GM/100ML-% IV SOLN
2.0000 g | Freq: Three times a day (TID) | INTRAVENOUS | Status: AC
  Administered 2020-04-02 (×3): 2 g via INTRAVENOUS
  Filled 2020-04-02 (×3): qty 100

## 2020-04-02 MED ORDER — ONDANSETRON HCL 4 MG/2ML IJ SOLN
INTRAMUSCULAR | Status: DC | PRN
  Administered 2020-04-02: 4 mg via INTRAVENOUS

## 2020-04-02 MED ORDER — ENOXAPARIN SODIUM 80 MG/0.8ML ~~LOC~~ SOLN
80.0000 mg | SUBCUTANEOUS | Status: DC
  Administered 2020-04-02 – 2020-04-03 (×2): 80 mg via SUBCUTANEOUS
  Filled 2020-04-02 (×2): qty 0.8

## 2020-04-02 MED ORDER — OXYCODONE HCL 5 MG/5ML PO SOLN: 5.0000 mg | Freq: Once | ORAL | Status: DC | PRN

## 2020-04-02 MED ORDER — SODIUM CHLORIDE (PF) 0.9 % IJ SOLN
INTRAMUSCULAR | Status: AC
  Filled 2020-04-02: qty 10

## 2020-04-02 MED ORDER — DEXMEDETOMIDINE (PRECEDEX) IN NS 20 MCG/5ML (4 MCG/ML) IV SYRINGE
PREFILLED_SYRINGE | INTRAVENOUS | Status: AC
  Filled 2020-04-02: qty 5

## 2020-04-02 MED ORDER — METHOCARBAMOL 1000 MG/10ML IJ SOLN
500.0000 mg | Freq: Three times a day (TID) | INTRAVENOUS | Status: DC
  Administered 2020-04-02 (×2): 500 mg via INTRAVENOUS
  Filled 2020-04-02 (×7): qty 5

## 2020-04-02 MED ORDER — VITAMIN D (ERGOCALCIFEROL) 1.25 MG (50000 UNIT) PO CAPS
50000.0000 [IU] | ORAL_CAPSULE | ORAL | Status: DC
  Administered 2020-04-02: 50000 [IU] via ORAL
  Filled 2020-04-02: qty 1

## 2020-04-02 MED ORDER — FENTANYL CITRATE (PF) 100 MCG/2ML IJ SOLN
INTRAMUSCULAR | Status: AC
  Filled 2020-04-02: qty 2

## 2020-04-02 MED ORDER — ADULT MULTIVITAMIN W/MINERALS CH
1.0000 | ORAL_TABLET | Freq: Every day | ORAL | Status: DC
  Administered 2020-04-02 – 2020-04-07 (×6): 1 via ORAL
  Filled 2020-04-02 (×6): qty 1

## 2020-04-02 MED ORDER — SUGAMMADEX SODIUM 200 MG/2ML IV SOLN
INTRAVENOUS | Status: DC | PRN
  Administered 2020-04-02: 200 mg via INTRAVENOUS

## 2020-04-02 MED ORDER — SUFENTANIL CITRATE 50 MCG/ML IV SOLN
INTRAVENOUS | Status: AC
  Filled 2020-04-02: qty 1

## 2020-04-02 NOTE — Transfer of Care (Signed)
Immediate Anesthesia Transfer of Care Note  Patient: Nicholas Randall  Procedure(s) Performed: ORIF PELVIS (N/A Pelvis) OPEN REDUCTION INTERNAL FIXATION (ORIF) WRIST FRACTURE (Right Wrist)  Patient Location: PACU  Anesthesia Type:General  Level of Consciousness: sedated, drowsy, patient cooperative and responds to stimulation  Airway & Oxygen Therapy: Patient Spontanous Breathing and Patient connected to nasal cannula oxygen  Post-op Assessment: Report given to RN, Post -op Vital signs reviewed and stable and Patient moving all extremities X 4  Post vital signs: Reviewed and stable  Last Vitals:  Vitals Value Taken Time  BP 166/114 04/02/20 0315  Temp 36.7 C 04/02/20 0314  Pulse 116 04/02/20 0317  Resp 25 04/02/20 0318  SpO2 77 % 04/02/20 0317  Vitals shown include unvalidated device data.  Last Pain:  Vitals:   04/01/20 1730  TempSrc:   PainSc: 10-Worst pain ever         Complications: No complications documented.
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
## 2020-04-02 NOTE — Plan of Care (Signed)
  Problem: Clinical Measurements: Goal: Ability to maintain clinical measurements within normal limits will improve Outcome: Progressing Goal: Will remain free from infection Outcome: Progressing Goal: Diagnostic test results will improve Outcome: Progressing Goal: Respiratory complications will improve Outcome: Progressing Goal: Cardiovascular complication will be avoided Outcome: Progressing   Problem: Activity: Goal: Risk for activity intolerance will decrease Outcome: Progressing   Problem: Nutrition: Goal: Adequate nutrition will be maintained Outcome: Progressing   Problem: Coping: Goal: Level of anxiety will decrease Outcome: Progressing   Problem: Pain Managment: Goal: General experience of comfort will improve Outcome: Progressing   Problem: Safety: Goal: Ability to remain free from injury will improve Outcome: Progressing   Problem: Skin Integrity: Goal: Risk for impaired skin integrity will decrease Outcome: Progressing   Problem: Skin Integrity: Goal: Demonstration of wound healing without infection will improve Outcome: Progressing
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
## 2020-04-02 NOTE — Progress Notes (Signed)
Called PA Montez Morita with ortho for patient's increasing progressively worsening pain in right wrist with pressure that he describes as "tight," despite scheduled and PRN pain medication. ROM is intact, no changes in neurovascular assessment to right upper extremity, warm, dry.  Per PA Renae Fickle, unwrap/ loosen ace bandage, if needed, can split the cast gauze under ace wrap if necessary, and continue to ice and elevate extremity. Will continue to monitor.

## 2020-04-02 NOTE — Anesthesia Postprocedure Evaluation (Signed)
Anesthesia Post Note  Patient: Nicholas Randall  Procedure(s) Performed: ORIF PELVIS (N/A Pelvis) OPEN REDUCTION INTERNAL FIXATION (ORIF) WRIST FRACTURE (Right Wrist)     Patient location during evaluation: PACU Anesthesia Type: General Level of consciousness: awake and alert Pain management: pain level controlled Vital Signs Assessment: post-procedure vital signs reviewed and stable Respiratory status: spontaneous breathing, nonlabored ventilation, respiratory function stable and patient connected to nasal cannula oxygen Cardiovascular status: blood pressure returned to baseline and stable Postop Assessment: no apparent nausea or vomiting Anesthetic complications: no   No complications documented.  Last Vitals:  Vitals:   04/02/20 0600 04/02/20 0700  BP: 118/68 115/62  Pulse: (!) 101 100  Resp: (!) 30 (!) 29  Temp:    SpO2: 98% 98%                  Beryle Lathe

## 2020-04-02 NOTE — Progress Notes (Signed)
Rehab Admissions Coordinator Note:  Patient was screened by Clois Dupes for appropriateness for an Inpatient Acute Rehab Consult per therapy recommendations. .  At this time, we are recommending Inpatient Rehab consult if patient fails to progress with therapy once pain lessens. Please advise.Clois Dupes RN MSN 04/02/2020, 2:32 PM  I can be reached at [phone].
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
## 2020-04-02 NOTE — TOC CAGE-AID Note (Signed)
Transition of Care [facility]) - CAGE-AID Screening   Patient Details  Name: Nicholas Randall MRN: [id number] Date of Birth: [birth year]  Transition of Care [facility]) CM/SW Contact:    Emeterio Reeve, [place] Phone Number: 04/02/2020, 12:51 PM   Clinical Narrative:  CSW met with pt at bedside. CSW introduced self and explained her role at the hospital.  Pt repots occasional social alcohol use. Pt denies substance use. Pt did not need resources at this time.   CAGE-AID Screening:    Have You Ever Felt You Ought to Cut Down on Your Drinking or Drug Use?: No Have People Annoyed You By Critizing Your Drinking Or Drug Use?: No Have You Felt Bad Or Guilty About Your Drinking Or Drug Use?: No Have You Ever Had a Drink or Used Drugs First Thing In The Morning to Steady Your Nerves or to Get Rid of a Hangover?: No CAGE-AID Score: 0  Substance Abuse Education Offered: Yes  Substance abuse interventions: Patient Counseling  Emeterio Reeve, Latanya Presser, [place] Social Worker [phone]
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
## 2020-04-03 ENCOUNTER — Encounter (HOSPITAL_COMMUNITY): Payer: Self-pay

## 2020-04-03 LAB — GLUCOSE, CAPILLARY
Glucose-Capillary: 111 mg/dL — ABNORMAL HIGH (ref 70–99)
Glucose-Capillary: 131 mg/dL — ABNORMAL HIGH (ref 70–99)
Glucose-Capillary: 102 mg/dL — ABNORMAL HIGH (ref 70–99)

## 2020-04-03 LAB — CBC
HCT: 31.6 % — ABNORMAL LOW (ref 39.0–52.0)
Hemoglobin: 10.2 g/dL — ABNORMAL LOW (ref 13.0–17.0)
MCH: 26.7 pg (ref 26.0–34.0)
MCHC: 32.3 g/dL (ref 30.0–36.0)
MCV: 82.7 fL (ref 80.0–100.0)
Platelets: 182 10*3/uL (ref 150–400)
RBC: 3.82 MIL/uL — ABNORMAL LOW (ref 4.22–5.81)
RDW: 15.1 % (ref 11.5–15.5)
WBC: 11.1 10*3/uL — ABNORMAL HIGH (ref 4.0–10.5)
nRBC: 0 % (ref 0.0–0.2)

## 2020-04-03 LAB — HEMOGLOBIN A1C
Hgb A1c MFr Bld: 5.8 % — ABNORMAL HIGH (ref 4.8–5.6)
Mean Plasma Glucose: 119.76 mg/dL

## 2020-04-03 MED ORDER — MORPHINE SULFATE (PF) 2 MG/ML IV SOLN
2.0000 mg | INTRAVENOUS | Status: DC | PRN
  Administered 2020-04-03 – 2020-04-04 (×2): 2 mg via INTRAVENOUS
  Filled 2020-04-03 (×2): qty 1

## 2020-04-03 MED ORDER — MORPHINE SULFATE (PF) 2 MG/ML IV SOLN
2.0000 mg | INTRAVENOUS | Status: DC | PRN
  Administered 2020-04-03 (×2): 2 mg via INTRAVENOUS
  Filled 2020-04-03 (×2): qty 1

## 2020-04-03 MED ORDER — GABAPENTIN 300 MG PO CAPS
300.0000 mg | ORAL_CAPSULE | Freq: Three times a day (TID) | ORAL | Status: DC
  Administered 2020-04-03 – 2020-04-07 (×13): 300 mg via ORAL
  Filled 2020-04-03 (×13): qty 1

## 2020-04-03 MED ORDER — ACETAMINOPHEN 500 MG PO TABS
1000.0000 mg | ORAL_TABLET | Freq: Four times a day (QID) | ORAL | Status: DC
  Administered 2020-04-03 – 2020-04-07 (×15): 1000 mg via ORAL
  Filled 2020-04-03 (×18): qty 2

## 2020-04-03 MED ORDER — INSULIN ASPART 100 UNIT/ML ~~LOC~~ SOLN: 0.0000 [IU] | Freq: Three times a day (TID) | SUBCUTANEOUS | Status: DC

## 2020-04-03 MED ORDER — INSULIN ASPART 100 UNIT/ML ~~LOC~~ SOLN
0.0000 [IU] | Freq: Three times a day (TID) | SUBCUTANEOUS | Status: DC
  Administered 2020-04-03: 2 [IU] via SUBCUTANEOUS

## 2020-04-03 MED ORDER — TRAMADOL HCL 50 MG PO TABS
50.0000 mg | ORAL_TABLET | Freq: Four times a day (QID) | ORAL | Status: DC
  Administered 2020-04-03 – 2020-04-07 (×18): 50 mg via ORAL
  Filled 2020-04-03 (×18): qty 1

## 2020-04-03 MED ORDER — METHOCARBAMOL 500 MG PO TABS
1000.0000 mg | ORAL_TABLET | Freq: Three times a day (TID) | ORAL | Status: DC
  Administered 2020-04-03 – 2020-04-07 (×14): 1000 mg via ORAL
  Filled 2020-04-03 (×14): qty 2

## 2020-04-03 MED ORDER — POLYETHYLENE GLYCOL 3350 17 G PO PACK
17.0000 g | PACK | Freq: Every day | ORAL | Status: DC
  Administered 2020-04-03 – 2020-04-07 (×6): 17 g via ORAL
  Filled 2020-04-03 (×5): qty 1

## 2020-04-03 NOTE — Plan of Care (Signed)
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
## 2020-04-03 NOTE — Progress Notes (Signed)
Pt arrived to unit via bed, transported via bed by ICU RN.  Pt transferred to bed, VS stable, reports pain 8/10.  Wound vac in place and operational.  Foley in place, foley care completed.  Pt oriented to unit and room.  Assisted with positioning.  He denies any other needs at this time.

## 2020-04-03 NOTE — Progress Notes (Signed)
Inpatient Rehab Admissions:  Inpatient Rehab Consult received.  I met with patient and his fiancee at the bedside for rehabilitation assessment and to discuss goals and expectations of an inpatient rehab admission.  Pt is open to CIR program when medically ready and bed available.  Plan to stay with fiancee at discharge and will have needed support.  Pt is an excellent candidate.  Will need insurance auth prior to potential admission.  Will continue to follow.   Signed: Shann Medal, PT, DPT Admissions Coordinator [phone] 04/03/20  3:36 PM
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
## 2020-04-04 LAB — CBC
HCT: 32.8 % — ABNORMAL LOW (ref 39.0–52.0)
Hemoglobin: 10.5 g/dL — ABNORMAL LOW (ref 13.0–17.0)
MCH: 26.4 pg (ref 26.0–34.0)
MCHC: 32 g/dL (ref 30.0–36.0)
MCV: 82.6 fL (ref 80.0–100.0)
Platelets: 182 10*3/uL (ref 150–400)
RBC: 3.97 MIL/uL — ABNORMAL LOW (ref 4.22–5.81)
RDW: 14.9 % (ref 11.5–15.5)
WBC: 11.6 10*3/uL — ABNORMAL HIGH (ref 4.0–10.5)
nRBC: 0 % (ref 0.0–0.2)

## 2020-04-04 LAB — GLUCOSE, CAPILLARY
Glucose-Capillary: 127 mg/dL — ABNORMAL HIGH (ref 70–99)
Glucose-Capillary: 113 mg/dL — ABNORMAL HIGH (ref 70–99)
Glucose-Capillary: 133 mg/dL — ABNORMAL HIGH (ref 70–99)

## 2020-04-04 MED ORDER — APIXABAN 2.5 MG PO TABS
2.5000 mg | ORAL_TABLET | Freq: Two times a day (BID) | ORAL | Status: DC
  Administered 2020-04-04 – 2020-04-07 (×7): 2.5 mg via ORAL
  Filled 2020-04-04 (×7): qty 1
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
## 2020-04-04 NOTE — Plan of Care (Signed)
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
## 2020-04-04 NOTE — Discharge Instructions (Addendum)
Please remain non-weight bearing to the right lower extremity. You are weight bearing as tolerating for transfers only  Please remain non-weightbearing throughout the wrist on the right  Follow up with orthopedics    Cast or Splint Care, Adult Casts and splints are supports that are worn to protect broken bones and other injuries. A cast or splint may hold a bone still and in the correct position while it heals. Casts and splints may also help ease pain, swelling, and muscle spasms. A cast is a hardened support that is usually made of fiberglass or plaster. It is custom-fit to the body and it offers more protection than a splint. It cannot be taken off and put back on. A splint is a type of soft support that is usually made from cloth and elastic. It can be adjusted or taken off as needed. You may need a cast or a splint if you:  Have a broken bone.  Have a soft-tissue injury.  Need to keep an injured body part from moving (keep it immobile) after surgery. How is this treated? If you have a cast:   Do not stick anything inside the cast to scratch your skin. Sticking something in the cast increases your risk of infection.  Check the skin around the cast every day. Tell your health care provider about any concerns.  You may put lotion on dry skin around the edges of the cast. Do not put lotion on the skin underneath the cast.  Keep the cast clean.  If the cast is not waterproof: ? Do not let it get wet. ? Cover it with a watertight covering when you take a bath or a shower. If you have a splint:   Wear it as told by your health care provider. Remove it only as told by your health care provider.  Loosen the splint if your fingers or toes tingle, become numb, or turn cold and blue.  Keep the splint clean.  If the splint is not waterproof: ? Do not let it get wet. ? Cover it with a watertight covering when you take a bath or a shower. Bathing  Do not take baths or swim until  your health care provider approves. Ask your health care provider if you can take showers. You may only be allowed to take sponge baths for bathing.  If your cast or splint is not waterproof, cover it with a watertight covering when you take a bath or shower. Managing pain, stiffness, and swelling  Move your fingers or toes often to avoid stiffness and to lessen swelling.  Raise (elevate) the injured area above the level of your heart while sitting or lying down. Safety  Do not use the injured limb to support your body weight until your health care provider says that it is okay.  Use crutches or other assistive devices as told by your health care provider. General instructions  Do not put pressure on any part of the cast or splint until it is fully hardened. This may take several hours.  Return to your normal activities as told by your health care provider. Ask your health care provider what activities are safe for you.  Take over-the-counter and prescription medicines only as told by your health care provider.  Keep all follow-up visits as told by your health care provider. This is important. Contact a health care provider if:  Your cast or splint gets damaged.  The skin around the cast gets red or raw.  The  skin under the cast is extremely itchy or painful.  Your cast or splint feels very uncomfortable.  Your cast or splint is too tight or too loose.  Your cast becomes wet or it develops a soft spot or area.  You get an object stuck under your cast. Get help right away if:  Your pain is getting worse.  The injured area tingles, becomes numb, or turns cold and blue.  The part of your body above or below the cast is swollen and discolored.  You cannot feel or move your fingers or toes.  There is fluid leaking through the cast.  You have severe pain or pressure under the cast.  You have trouble breathing.  You have shortness of breath.  You have chest pain. This  information is not intended to replace advice given to you by your health care provider. Make sure you discuss any questions you have with your health care provider. Document Revised: 06/27/2017 Document Reviewed: 02/21/2016 Elsevier Patient Education  2020 [employer].    Information on my medicine - ELIQUIS (apixaban)  This medication education was reviewed with me or my healthcare representative as part of my discharge preparation.     Why was Eliquis prescribed for you? Eliquis was prescribed for you to reduce the risk of blood clots forming after orthopedic surgery.    What do You need to know about Eliquis? Take your Eliquis TWICE DAILY - one tablet in the morning and one tablet in the evening with or without food.  It would be best to take the dose about the same time each day.  If you have difficulty swallowing the tablet whole please discuss with your pharmacist how to take the medication safely.  Take Eliquis exactly as prescribed by your doctor and DO NOT stop taking Eliquis without talking to the doctor who prescribed the medication.  Stopping without other medication to take the place of Eliquis may increase your risk of developing a clot.  After discharge, you should have regular check-up appointments with your healthcare provider that is prescribing your Eliquis.  What do you do if you miss a dose? If a dose of ELIQUIS is not taken at the scheduled time, take it as soon as possible on the same day and twice-daily administration should be resumed.  The dose should not be doubled to make up for a missed dose.  Do not take more than one tablet of ELIQUIS at the same time.  Important Safety Information A possible side effect of Eliquis is bleeding. You should call your healthcare provider right away if you experience any of the following: ? Bleeding from an injury or your nose that does not stop. ? Unusual colored urine (red or dark brown) or unusual colored stools  (red or black). ? Unusual bruising for unknown reasons. ? A serious fall or if you hit your head (even if there is no bleeding).  Some medicines may interact with Eliquis and might increase your risk of bleeding or clotting while on Eliquis. To help avoid this, consult your healthcare provider or pharmacist prior to using any new prescription or non-prescription medications, including herbals, vitamins, non-steroidal anti-inflammatory drugs (NSAIDs) and supplements.  This website has more information on Eliquis (apixaban): http://www.eliquis.com/eliquis/home   Preventing Type 2 Diabetes Mellitus Type 2 diabetes (type 2 diabetes mellitus) is a long-term (chronic) disease that affects blood sugar (glucose) levels. Normally, a hormone called insulin allows glucose to enter cells in the body. The cells use glucose for  energy. In type 2 diabetes, one or both of these problems may be present:  The body does not make enough insulin.  The body does not respond properly to insulin that it makes (insulin resistance). Insulin resistance or lack of insulin causes excess glucose to build up in the blood instead of going into cells. As a result, high blood glucose (hyperglycemia) develops, which can cause many complications. Being overweight or obese and having an inactive (sedentary) lifestyle can increase your risk for diabetes. Type 2 diabetes can be delayed or prevented by making certain nutrition and lifestyle changes. What nutrition changes can be made?   Eat healthy meals and snacks regularly. Keep a healthy snack with you for when you get hungry between meals, such as fruit or a handful of nuts.  Eat lean meats and proteins that are low in saturated fats, such as chicken, fish, egg whites, and beans. Avoid processed meats.  Eat plenty of fruits and vegetables and plenty of grains that have not been processed (whole grains). It is recommended that you eat: ? 1?2 cups of fruit every day. ? 2?3  cups of vegetables every day. ? 6?8 oz of whole grains every day, such as oats, whole wheat, bulgur, brown rice, quinoa, and millet.  Eat low-fat dairy products, such as milk, yogurt, and cheese.  Eat foods that contain healthy fats, such as nuts, avocado, olive oil, and canola oil.  Drink water throughout the day. Avoid drinks that contain added sugar, such as soda or sweet tea.  Follow instructions from your health care provider about specific eating or drinking restrictions.  Control how much food you eat at a time (portion size). ? Check food labels to find out the serving sizes of foods. ? Use a kitchen scale to weigh amounts of foods.  Saute or steam food instead of frying it. Cook with water or broth instead of oils or butter.  Limit your intake of: ? Salt (sodium). Have no more than 1 tsp (2,400 mg) of sodium a day. If you have heart disease or high blood pressure, have less than ? tsp (1,500 mg) of sodium a day. ? Saturated fat. This is fat that is solid at room temperature, such as butter or fat on meat. What lifestyle changes can be made? Activity   Do moderate-intensity physical activity for at least 30 minutes on at least 5 days of the week, or as much as told by your health care provider.  Ask your health care provider what activities are safe for you. A mix of physical activities may be best, such as walking, swimming, cycling, and strength training.  Try to add physical activity into your day. For example: ? Park in spots that are farther away than usual, so that you walk more. For example, park in a far corner of the parking lot when you go to the office or the grocery store. ? Take a walk during your lunch break. ? Use stairs instead of elevators or escalators. Weight Loss  Lose weight as directed. Your health care provider can determine how much weight loss is best for you and can help you lose weight safely.  If you are overweight or obese, you may be  instructed to lose at least 5?7 % of your body weight. Alcohol and Tobacco   Limit alcohol intake to no more than 1 drink a day for nonpregnant women and 2 drinks a day for men. One drink equals 12 oz of beer, 5 oz of  wine, or 1 oz of hard liquor.  Do not use any tobacco products, such as cigarettes, chewing tobacco, and e-cigarettes. If you need help quitting, ask your health care provider. Work With Your Health Care Provider  Have your blood glucose tested regularly, as told by your health care provider.  Discuss your risk factors and how you can reduce your risk for diabetes.  Get screening tests as told by your health care provider. You may have screening tests regularly, especially if you have certain risk factors for type 2 diabetes.  Make an appointment with a diet and nutrition specialist (registered dietitian). A registered dietitian can help you make a healthy eating plan and can help you understand portion sizes and food labels. Why are these changes important?  It is possible to prevent or delay type 2 diabetes and related health problems by making lifestyle and nutrition changes.  It can be difficult to recognize signs of type 2 diabetes. The best way to avoid possible damage to your body is to take actions to prevent the disease before you develop symptoms. What can happen if changes are not made?  Your blood glucose levels may keep increasing. Having high blood glucose for a long time is dangerous. Too much glucose in your blood can damage your blood vessels, heart, kidneys, nerves, and eyes.  You may develop prediabetes or type 2 diabetes. Type 2 diabetes can lead to many chronic health problems and complications, such as: ? Heart disease. ? Stroke. ? Blindness. ? Kidney disease. ? Depression. ? Poor circulation in the feet and legs, which could lead to surgical removal (amputation) in severe cases. Where to find support  Ask your health care provider to recommend a  registered dietitian, diabetes educator, or weight loss program.  Look for local or online weight loss groups.  Join a gym, fitness club, or outdoor activity group, such as a walking club. Where to find more information To learn more about diabetes and diabetes prevention, visit:  American Diabetes Association (ADA): www.diabetes.[employer] of Diabetes and Digestive and Kidney Diseases: ToyArt[employer].ca To learn more about healthy eating, visit:  The U.S. Department of Agriculture Architect), Choose My Plate: http://yates.biz/  Office of Disease Prevention and Health Promotion (ODPHP), Dietary Guidelines: ListingMagazine.si Summary  You can reduce your risk for type 2 diabetes by increasing your physical activity, eating healthy foods, and losing weight as directed.  Talk with your health care provider about your risk for type 2 diabetes. Ask about any blood tests or screening tests that you need to have. This information is not intended to replace advice given to you by your health care provider. Make sure you discuss any questions you have with your health care provider. Document Revised: 12/22/2018 Document Reviewed: 10/21/2015 Elsevier Patient Education  2020 [employer].

## 2020-04-04 NOTE — Progress Notes (Signed)
Inpatient Rehab Admissions Coordinator:   I will open insurance for prior auth for CIR today.  Will follow for possible admission in the next few days pending approval and bed availability.   Estill Dooms, PT, DPT Admissions Coordinator [phone] 04/04/20  12:09 PM

## 2020-04-04 NOTE — Progress Notes (Signed)
ANTICOAGULATION CONSULT NOTE - Initial Consult  Pharmacy Consult for apixaban Indication: Post-op ortho ppx   No Known Allergies  Patient Measurements: Height: 5\' 9"  (175.3 cm) Weight: (!) 163.3 kg (360 lb 0.2 oz) IBW/kg (Calculated) : 70.7   Labs: Recent Labs    04/01/20 1521 04/01/20 1521 04/01/20 1536 04/02/20 0600 04/02/20 0600 04/03/20 0803 04/04/20 0420  HGB 13.0   < > 13.6 11.2*   < > 10.2* 10.5*  HCT 42.9   < > 40.0 35.6*  --  31.6* 32.8*  PLT 258   < >  --  201  --  182 182  CREATININE 1.06  --  1.00 1.07  --   --   --    < > = values in this interval not displayed.    Estimated Creatinine Clearance: 159.4 mL/min (by C-G formula based on SCr of 1.07 mg/dL).   Assessment: 29 yo M s/p open pelvic fixation and ORIF Wrist 7/20. Pharmacy is consulted to transition to enoxaparin to apixaban for 6 - 8 weeks for post-op DVT prophylaxis. Enoxaparin was started on 7/21. H/H, plt dropped post-op and stable since. No signs of bleeding.    Goal of Therapy:  Monitor platelets by anticoagulation protocol: Yes   Plan:  Stop enoxaparin Start Apixaban 2.5mg  BID  Monitor for s/sx of bleeding  F/u end date (6-8 wks = 8/31 - 9/14)    09-16-2001, PharmD, BCPS, BCCP Clinical Pharmacist  Please check AMION for all [facility] Pharmacy phone numbers After 10:00 PM, call Main Pharmacy [phone]
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
## 2020-04-04 NOTE — Plan of Care (Signed)
  Problem: Education: Goal: Knowledge of General Education information will improve Description: Including pain rating scale, medication(s)/side effects and non-pharmacologic comfort measures Outcome: Progressing   Problem: Pain Managment: Goal: General experience of comfort will improve Outcome: Progressing   Problem: Safety: Goal: Ability to remain free from injury will improve Outcome: Progressing   

## 2020-04-05 MED ORDER — MAGNESIUM CITRATE PO SOLN
1.0000 | Freq: Once | ORAL | Status: AC
  Administered 2020-04-05: 1 via ORAL
  Filled 2020-04-05: qty 296
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
## 2020-04-05 NOTE — Plan of Care (Signed)
  Problem: Education: Goal: Knowledge of General Education information will improve Description: Including pain rating scale, medication(s)/side effects and non-pharmacologic comfort measures Outcome: Progressing   Problem: Pain Managment: Goal: General experience of comfort will improve Outcome: Progressing   Problem: Safety: Goal: Ability to remain free from injury will improve Outcome: Progressing   

## 2020-04-05 NOTE — Plan of Care (Signed)
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
## 2020-04-06 NOTE — Plan of Care (Signed)

## 2020-04-06 NOTE — Plan of Care (Signed)

## 2020-04-07 ENCOUNTER — Encounter (HOSPITAL_COMMUNITY): Payer: Self-pay | Admitting: Emergency Medicine

## 2020-04-07 ENCOUNTER — Encounter (HOSPITAL_COMMUNITY): Payer: Self-pay | Admitting: Physical Medicine & Rehabilitation

## 2020-04-07 ENCOUNTER — Other Ambulatory Visit: Payer: Self-pay

## 2020-04-07 ENCOUNTER — Inpatient Hospital Stay (HOSPITAL_COMMUNITY)
Admission: RE | Admit: 2020-04-07 | Discharge: 2020-04-16 | DRG: 560 | Disposition: A | Discharge status: Home / Self Care | Payer: PRIVATE HEALTH INSURANCE | Source: Intra-hospital | Attending: Physical Medicine & Rehabilitation | Admitting: Physical Medicine & Rehabilitation

## 2020-04-07 DIAGNOSIS — K5900 Constipation, unspecified: Secondary | ICD-10-CM | POA: Diagnosis present

## 2020-04-07 DIAGNOSIS — S32810A Multiple fractures of pelvis with stable disruption of pelvic ring, initial encounter for closed fracture: Secondary | ICD-10-CM

## 2020-04-07 DIAGNOSIS — S52309A Unspecified fracture of shaft of unspecified radius, initial encounter for closed fracture: Secondary | ICD-10-CM

## 2020-04-07 DIAGNOSIS — M7989 Other specified soft tissue disorders: Secondary | ICD-10-CM | POA: Diagnosis not present

## 2020-04-07 DIAGNOSIS — M25571 Pain in right ankle and joints of right foot: Secondary | ICD-10-CM | POA: Diagnosis present

## 2020-04-07 DIAGNOSIS — Z7289 Other problems related to lifestyle: Secondary | ICD-10-CM | POA: Diagnosis not present

## 2020-04-07 DIAGNOSIS — K4090 Unilateral inguinal hernia, without obstruction or gangrene, not specified as recurrent: Secondary | ICD-10-CM | POA: Diagnosis present

## 2020-04-07 DIAGNOSIS — T380X5A Adverse effect of glucocorticoids and synthetic analogues, initial encounter: Secondary | ICD-10-CM | POA: Diagnosis not present

## 2020-04-07 DIAGNOSIS — D62 Acute posthemorrhagic anemia: Secondary | ICD-10-CM | POA: Diagnosis present

## 2020-04-07 DIAGNOSIS — G546 Phantom limb syndrome with pain: Secondary | ICD-10-CM | POA: Diagnosis present

## 2020-04-07 DIAGNOSIS — F17210 Nicotine dependence, cigarettes, uncomplicated: Secondary | ICD-10-CM | POA: Diagnosis present

## 2020-04-07 DIAGNOSIS — M549 Dorsalgia, unspecified: Secondary | ICD-10-CM

## 2020-04-07 DIAGNOSIS — R739 Hyperglycemia, unspecified: Secondary | ICD-10-CM | POA: Diagnosis present

## 2020-04-07 DIAGNOSIS — S32810S Multiple fractures of pelvis with stable disruption of pelvic ring, sequela: Secondary | ICD-10-CM | POA: Diagnosis not present

## 2020-04-07 DIAGNOSIS — S63074D Dislocation of distal end of right ulna, subsequent encounter: Secondary | ICD-10-CM | POA: Diagnosis not present

## 2020-04-07 DIAGNOSIS — Z6841 Body Mass Index (BMI) 40.0 and over, adult: Secondary | ICD-10-CM

## 2020-04-07 DIAGNOSIS — D72829 Elevated white blood cell count, unspecified: Secondary | ICD-10-CM | POA: Diagnosis not present

## 2020-04-07 DIAGNOSIS — S329XXB Fracture of unspecified parts of lumbosacral spine and pelvis, initial encounter for open fracture: Secondary | ICD-10-CM | POA: Diagnosis present

## 2020-04-07 DIAGNOSIS — M5432 Sciatica, left side: Secondary | ICD-10-CM | POA: Diagnosis present

## 2020-04-07 DIAGNOSIS — Z8249 Family history of ischemic heart disease and other diseases of the circulatory system: Secondary | ICD-10-CM

## 2020-04-07 DIAGNOSIS — S32811D Multiple fractures of pelvis with unstable disruption of pelvic ring, subsequent encounter for fracture with routine healing: Secondary | ICD-10-CM | POA: Diagnosis present

## 2020-04-07 DIAGNOSIS — R609 Edema, unspecified: Secondary | ICD-10-CM | POA: Diagnosis not present

## 2020-04-07 DIAGNOSIS — S52591D Other fractures of lower end of right radius, subsequent encounter for closed fracture with routine healing: Secondary | ICD-10-CM | POA: Diagnosis not present

## 2020-04-07 DIAGNOSIS — S32810D Multiple fractures of pelvis with stable disruption of pelvic ring, subsequent encounter for fracture with routine healing: Secondary | ICD-10-CM

## 2020-04-07 MED ORDER — ONDANSETRON HCL 4 MG/2ML IJ SOLN: 4.0000 mg | Freq: Four times a day (QID) | INTRAMUSCULAR | Status: DC | PRN

## 2020-04-07 MED ORDER — PANTOPRAZOLE SODIUM 40 MG PO TBEC
40.0000 mg | DELAYED_RELEASE_TABLET | Freq: Every day | ORAL | Status: DC
  Administered 2020-04-08 – 2020-04-16 (×9): 40 mg via ORAL
  Filled 2020-04-07 (×9): qty 1

## 2020-04-07 MED ORDER — ASCORBIC ACID 500 MG PO TABS
1000.0000 mg | ORAL_TABLET | Freq: Every day | ORAL | Status: DC
  Administered 2020-04-08 – 2020-04-16 (×9): 1000 mg via ORAL
  Filled 2020-04-07 (×9): qty 2

## 2020-04-07 MED ORDER — PREGABALIN 75 MG PO CAPS
75.0000 mg | ORAL_CAPSULE | Freq: Two times a day (BID) | ORAL | Status: DC
  Administered 2020-04-07 – 2020-04-09 (×4): 75 mg via ORAL
  Filled 2020-04-07 (×4): qty 1

## 2020-04-07 MED ORDER — PREGABALIN 75 MG PO CAPS
75.0000 mg | ORAL_CAPSULE | Freq: Two times a day (BID) | ORAL | Status: DC
  Administered 2020-04-07: 75 mg via ORAL
  Filled 2020-04-07: qty 1

## 2020-04-07 MED ORDER — ADULT MULTIVITAMIN W/MINERALS CH
1.0000 | ORAL_TABLET | Freq: Every day | ORAL | Status: DC
  Administered 2020-04-08 – 2020-04-16 (×9): 1 via ORAL
  Filled 2020-04-07 (×9): qty 1

## 2020-04-07 MED ORDER — DOCUSATE SODIUM 100 MG PO CAPS
100.0000 mg | ORAL_CAPSULE | Freq: Two times a day (BID) | ORAL | Status: DC
  Administered 2020-04-07 – 2020-04-16 (×18): 100 mg via ORAL
  Filled 2020-04-07 (×19): qty 1

## 2020-04-07 MED ORDER — TRAMADOL HCL 50 MG PO TABS
50.0000 mg | ORAL_TABLET | Freq: Four times a day (QID) | ORAL | Status: DC
  Administered 2020-04-07 – 2020-04-16 (×35): 50 mg via ORAL
  Filled 2020-04-07 (×36): qty 1

## 2020-04-07 MED ORDER — METHOCARBAMOL 500 MG PO TABS
1000.0000 mg | ORAL_TABLET | Freq: Three times a day (TID) | ORAL | Status: DC
  Administered 2020-04-07 – 2020-04-16 (×27): 1000 mg via ORAL
  Filled 2020-04-07 (×27): qty 2

## 2020-04-07 MED ORDER — ONDANSETRON 4 MG PO TBDP: 4.0000 mg | ORAL_TABLET | Freq: Four times a day (QID) | ORAL | Status: DC | PRN

## 2020-04-07 MED ORDER — VITAMIN D (ERGOCALCIFEROL) 1.25 MG (50000 UNIT) PO CAPS
50000.0000 [IU] | ORAL_CAPSULE | ORAL | Status: DC
  Administered 2020-04-09: 50000 [IU] via ORAL
  Filled 2020-04-07 (×2): qty 1

## 2020-04-07 MED ORDER — APIXABAN 2.5 MG PO TABS
2.5000 mg | ORAL_TABLET | Freq: Two times a day (BID) | ORAL | Status: DC
  Administered 2020-04-07 – 2020-04-08 (×2): 2.5 mg via ORAL
  Filled 2020-04-07 (×2): qty 1

## 2020-04-07 MED ORDER — BISACODYL 10 MG RE SUPP: 10.0000 mg | Freq: Every day | RECTAL | Status: DC | PRN

## 2020-04-07 MED ORDER — SORBITOL 70 % SOLN
30.0000 mL | Freq: Every day | Status: DC | PRN
  Administered 2020-04-09: 30 mL via ORAL
  Filled 2020-04-07 (×2): qty 30

## 2020-04-07 MED ORDER — ACETAMINOPHEN 325 MG PO TABS
325.0000 mg | ORAL_TABLET | ORAL | Status: DC | PRN
  Administered 2020-04-09: 650 mg via ORAL
  Filled 2020-04-07: qty 2

## 2020-04-07 MED ORDER — VITAMIN D 25 MCG (1000 UNIT) PO TABS
2000.0000 [IU] | ORAL_TABLET | Freq: Two times a day (BID) | ORAL | Status: DC
  Administered 2020-04-07 – 2020-04-16 (×18): 2000 [IU] via ORAL
  Filled 2020-04-07 (×18): qty 2

## 2020-04-07 MED ORDER — POLYETHYLENE GLYCOL 3350 17 G PO PACK
17.0000 g | PACK | Freq: Every day | ORAL | Status: DC
  Administered 2020-04-08 – 2020-04-16 (×9): 17 g via ORAL
  Filled 2020-04-07 (×9): qty 1

## 2020-04-07 MED ORDER — OXYCODONE HCL 5 MG PO TABS
5.0000 mg | ORAL_TABLET | ORAL | Status: DC | PRN
  Administered 2020-04-07: 5 mg via ORAL
  Administered 2020-04-08 – 2020-04-10 (×11): 10 mg via ORAL
  Administered 2020-04-11: 5 mg via ORAL
  Administered 2020-04-11 – 2020-04-12 (×2): 10 mg via ORAL
  Filled 2020-04-07 (×2): qty 2
  Filled 2020-04-07: qty 1
  Filled 2020-04-07 (×12): qty 2
  Filled 2020-04-07: qty 1
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
## 2020-04-07 NOTE — Progress Notes (Signed)
OT SPLINT NOTE    RN STAFF  Please check splint every 4 hours during shift ( remove splint , remove stockinette/ dressing present) to assess for: * pain * redness *swelling  If any symptoms above present remove splint for 15 minutes. If symptoms continue - keep the splint removed and notify OT staff [phone] immediately.   Keep the UE elevated at all times on pillows / towels.  Splint can be cleaned with warm soapy water and alcohol swab. Splint should not be placed in heat of any kind because the splint with mold into a new shape.    OT fabricated custom [name] for R UE. Pt is not allowed to pronate R UE at this time. Pt must remain in 90 degree supinated position.  OT to check splint fit and adjust accordingly after PT session to allow patient wear time in splint.   SPLINT FIT:      Nicholas Randall, OTR/L  Acute Rehabilitation Services Pager: [phone] Office: [phone] .
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
## 2020-04-07 NOTE — Progress Notes (Addendum)
Inpatient Rehab Admissions Coordinator:   I have received insurance authorization for CIR; however, I do not have a bed available for this patient today.  Will continue to follow for timing of rehab admission pending bed availability. Met with pt and fiancee (Kenyatta) to update.   Shann Medal, PT, DPT Admissions Coordinator [phone] 04/07/20  10:35 AM
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
## 2020-04-07 NOTE — Progress Notes (Addendum)
1800 Pt is A&O x4, pleasant and cooperative. Assisted pt from chair to bed with standby assist. Right arm splint dry and intact. Wound vac to suprapubic area dry and intact, no output.  Pain is controlled with oral narcotics.
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
## 2020-04-07 NOTE — Progress Notes (Signed)
OT NOTE  OT checking splint and pt complains of pressure at thumb area. Pt skin checked and no pressure noted. Skin appears moist due to ending PT session to transfer to chair. OT enlarged the thumb area to allow more clearance. Pt reports "yes that's it". OT to recheck splint in the morning.   Timmothy Euler, OTR/L  Acute Rehabilitation Services Pager: [phone] Office: [phone] .

## 2020-04-07 NOTE — Progress Notes (Signed)
Inpatient Rehabilitation Medication Review by a Pharmacist  A complete drug regimen review was completed for this patient to identify any potential clinically significant medication issues.  Clinically significant medication issues were identified:  yes   Type of Medication Issue Identified Description of Issue Urgent (address now) Non-Urgent (address on AM team rounds) Plan Plan Accepted by Provider? (Yes / No / Pending AM Rounds)  Drug Interaction(s) (clinically significant)       Duplicate Therapy       Allergy       No Medication Administration End Date  Apixaban x 6 weeks per ortho PA; no stop date entered on order upon CIR admission Non-urgent  F/u with MD to obtain stop date for apixaban pending  Incorrect Dose       Additional Drug Therapy Needed       Other         Pharmacist comments: non-urgent medication issue identified. Pharmacy will follow up with physician/APP on rounds  Time spent performing this drug regimen review (minutes):  10 mins   Harlow Mares, PharmD Clinical Pharmacist  04/07/2020   8:00 PM   Please check AMION for all [facility] Pharmacy phone numbers After 10:00 PM, call the Main Pharmacy [phone]
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
## 2020-04-08 ENCOUNTER — Inpatient Hospital Stay (HOSPITAL_COMMUNITY): Payer: Self-pay | Admitting: Occupational Therapy

## 2020-04-08 ENCOUNTER — Inpatient Hospital Stay (HOSPITAL_COMMUNITY): Payer: Self-pay | Admitting: Physical Therapy

## 2020-04-08 ENCOUNTER — Inpatient Hospital Stay (HOSPITAL_COMMUNITY): Payer: PRIVATE HEALTH INSURANCE

## 2020-04-08 DIAGNOSIS — M7989 Other specified soft tissue disorders: Secondary | ICD-10-CM

## 2020-04-08 DIAGNOSIS — R609 Edema, unspecified: Secondary | ICD-10-CM

## 2020-04-08 DIAGNOSIS — S32810A Multiple fractures of pelvis with stable disruption of pelvic ring, initial encounter for closed fracture: Secondary | ICD-10-CM

## 2020-04-08 DIAGNOSIS — S32810D Multiple fractures of pelvis with stable disruption of pelvic ring, subsequent encounter for fracture with routine healing: Secondary | ICD-10-CM

## 2020-04-08 LAB — COMPREHENSIVE METABOLIC PANEL
ALT: 86 U/L — ABNORMAL HIGH (ref 0–44)
AST: 60 U/L — ABNORMAL HIGH (ref 15–41)
Albumin: 3 g/dL — ABNORMAL LOW (ref 3.5–5.0)
Alkaline Phosphatase: 56 U/L (ref 38–126)
Anion gap: 10 (ref 5–15)
BUN: 8 mg/dL (ref 6–20)
CO2: 22 mmol/L (ref 22–32)
Calcium: 9.2 mg/dL (ref 8.9–10.3)
Chloride: 105 mmol/L (ref 98–111)
Creatinine, Ser: 0.91 mg/dL (ref 0.61–1.24)
GFR calc Af Amer: >60 mL/min (ref >60)
GFR calc non Af Amer: >60 mL/min (ref >60)
Glucose, Bld: 112 mg/dL — ABNORMAL HIGH (ref 70–99)
Potassium: 4.1 mmol/L (ref 3.5–5.1)
Sodium: 137 mmol/L (ref 135–145)
Total Bilirubin: 0.6 mg/dL (ref 0.3–1.2)
Total Protein: 6.4 g/dL — ABNORMAL LOW (ref 6.5–8.1)

## 2020-04-08 LAB — CBC WITH DIFFERENTIAL/PLATELET
Abs Immature Granulocytes: 0.23 10*3/uL — ABNORMAL HIGH (ref 0.00–0.07)
Basophils Absolute: 0.1 10*3/uL (ref 0.0–0.1)
Basophils Relative: 0 %
Eosinophils Absolute: 0.3 10*3/uL (ref 0.0–0.5)
Eosinophils Relative: 3 %
HCT: 31.1 % — ABNORMAL LOW (ref 39.0–52.0)
Hemoglobin: 9.9 g/dL — ABNORMAL LOW (ref 13.0–17.0)
Immature Granulocytes: 2 %
Lymphocytes Relative: 17 %
Lymphs Abs: 2.2 10*3/uL (ref 0.7–4.0)
MCH: 25.9 pg — ABNORMAL LOW (ref 26.0–34.0)
MCHC: 31.8 g/dL (ref 30.0–36.0)
MCV: 81.4 fL (ref 80.0–100.0)
Monocytes Absolute: 1.2 10*3/uL — ABNORMAL HIGH (ref 0.1–1.0)
Monocytes Relative: 9 %
Neutro Abs: 8.5 10*3/uL — ABNORMAL HIGH (ref 1.7–7.7)
Neutrophils Relative %: 69 %
Platelets: 243 10*3/uL (ref 150–400)
RBC: 3.82 MIL/uL — ABNORMAL LOW (ref 4.22–5.81)
RDW: 14.6 % (ref 11.5–15.5)
WBC: 12.5 10*3/uL — ABNORMAL HIGH (ref 4.0–10.5)
nRBC: 0 % (ref 0.0–0.2)

## 2020-04-08 MED ORDER — EXERCISE FOR HEART AND HEALTH BOOK
Freq: Once | Status: AC
  Filled 2020-04-08: qty 1

## 2020-04-08 MED ORDER — APIXABAN 2.5 MG PO TABS
2.5000 mg | ORAL_TABLET | Freq: Two times a day (BID) | ORAL | Status: DC
  Administered 2020-04-08 – 2020-04-16 (×16): 2.5 mg via ORAL
  Filled 2020-04-08 (×16): qty 1
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
## 2020-04-08 NOTE — Progress Notes (Signed)
Physical Therapy Session Note  Patient Details  Name: Nicholas Randall MRN: [id number] Date of Birth: 01/31/1991  Today's Date: 04/08/2020 PT Individual Time: 0930-1030 PT Individual Time Calculation (min): 60 min   Short Term Goals: Week 1:  PT Short Term Goal 1 (Week 1): STG=LTG secondary to length of stay  Skilled Therapeutic Interventions/Progress Updates:   Pt received supine in bed and agreeable to PT at bed level. Pt reports that current. WC is too small. PT assessed pt's body size and obtained 24" wide WC with ELR to accommodate pt body habitus.  PT instructed pt in bed level LE and UE therex: hip abduction x8, knee flexion/extension x8, SAQ, x 10, ankle PF/DFx 20. AAROM provided to the LLE due pelvic pain. Bicep curl, tricep extension, Lat pull down, each completed x 12 for the RUE with cues for decreased speed prolonged rest break between bouts of LE therex. Pt left supine in bed with call bell in reach and all needs met.   Therapy Documentation Precautions:  Precautions Precautions: Fall Precaution Booklet Issued: No Precaution Comments: wound vac Required Braces or Orthoses: Splint/Cast Splint/Cast: Rt post op cast/splint Restrictions Weight Bearing Restrictions: Yes RUE Weight Bearing: Weight bear through elbow only RLE Weight Bearing: Non weight bearing LLE Weight Bearing: Weight bearing as tolerated (transfers only) Vital Signs: Therapy Vitals Temp: 98.4 F (36.9 C) Pulse Rate: 79 Resp: 18 BP: (!) 147/92 Patient Position (if appropriate): Lying Oxygen Therapy SpO2: 100 % O2 Device: Room Air Pain: Pain Assessment Pain Scale: 0-10 Pain Score: 5  Pain Type: Acute pain;Surgical pain Pain Location: Wrist Pain Orientation: Right Pain Descriptors / Indicators: Aching Pain Onset: On-going Patients Stated Pain Goal: 3 Pain Intervention(s): Medication (See eMAR);RN made aware;Repositioned  Therapy/Group: Individual Therapy  Lorie Phenix 04/08/2020, 2:54 PM

## 2020-04-08 NOTE — Progress Notes (Signed)
Inpatient Rehabilitation  Patient information reviewed and entered into eRehab system by Rueben Kassim M. Orel Hord, M.A., CCC/SLP, PPS Coordinator.  Information including medical coding, functional ability and quality indicators will be reviewed and updated through discharge.    
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
## 2020-04-08 NOTE — Progress Notes (Signed)
Lower extremity venous has been completed.   Preliminary results in CV Proc.   Blanch Media 04/08/2020 2:01 PM
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
## 2020-04-08 NOTE — Progress Notes (Signed)
Inpatient Rehabilitation Center Individual Statement of Services  Patient Name:  Nicholas Randall  Date:  04/08/2020  Welcome to the Inpatient Rehabilitation Center.  Our goal is to provide you with an individualized program based on your diagnosis and situation, designed to meet your specific needs.  With this comprehensive rehabilitation program, you will be expected to participate in at least 3 hours of rehabilitation therapies Monday-Friday, with modified therapy programming on the weekends.  Your rehabilitation program will include the following services:  Physical Therapy (PT), Occupational Therapy (OT), 24 hour per day rehabilitation nursing, Therapeutic Recreation (TR), Care Coordinator, Rehabilitation Medicine, Nutrition Services and Pharmacy Services  Weekly team conferences will be held on Wednesdays to discuss your progress.  Your Inpatient Rehabilitation Care Coordinator will talk with you frequently to get your input and to update you on team discussions.  Team conferences with you and your family in attendance may also be held.  Expected length of stay: 10 days Overall anticipated outcome: Min assist - Modified I WC level  Depending on your progress and recovery, your program may change. Your Inpatient Rehabilitation Care Coordinator will coordinate services and will keep you informed of any changes. Your Inpatient Rehabilitation Care Coordinator's name and contact numbers are listed  below.  The following services may also be recommended but are not provided by the Inpatient Rehabilitation Center:    Home Health Rehabilitation Services  Outpatient Rehabilitation Services   Arrangements will be made to provide these services after discharge if needed.  Arrangements include referral to agencies that provide these services.  Your insurance has been verified to be:  [employer] Your primary doctor is:  No PCP  Pertinent information will be shared with your doctor and your insurance  company.  Inpatient Rehabilitation Care Coordinator:  Chana Bode, RN, BSN, CRRN ([phone]  Information discussed with and copy given to patient by: Pamelia Hoit, 04/08/2020, 2:05 PM
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
## 2020-04-09 ENCOUNTER — Inpatient Hospital Stay (HOSPITAL_COMMUNITY): Payer: PRIVATE HEALTH INSURANCE | Admitting: Physical Therapy

## 2020-04-09 ENCOUNTER — Inpatient Hospital Stay (HOSPITAL_COMMUNITY): Payer: PRIVATE HEALTH INSURANCE | Admitting: Occupational Therapy

## 2020-04-09 ENCOUNTER — Inpatient Hospital Stay (HOSPITAL_COMMUNITY): Payer: PRIVATE HEALTH INSURANCE

## 2020-04-09 MED ORDER — DIPHENHYDRAMINE HCL 25 MG PO CAPS
25.0000 mg | ORAL_CAPSULE | Freq: Four times a day (QID) | ORAL | Status: DC | PRN
  Administered 2020-04-09: 25 mg via ORAL
  Filled 2020-04-09: qty 1

## 2020-04-09 MED ORDER — PREGABALIN 75 MG PO CAPS
150.0000 mg | ORAL_CAPSULE | Freq: Two times a day (BID) | ORAL | Status: DC
  Administered 2020-04-09 – 2020-04-11 (×4): 150 mg via ORAL
  Filled 2020-04-09 (×4): qty 2

## 2020-04-09 NOTE — Progress Notes (Signed)
Physical Therapy Session Note  Patient Details  Name: Nicholas Randall MRN: [id number] Date of Birth: [birth year]  Today's Date: 04/09/2020 PT Individual Time: 1400-1500 PT Individual Time Calculation (min): 60 min   Short Term Goals: Week 1:  PT Short Term Goal 1 (Week 1): STG=LTG secondary to length of stay  Skilled Therapeutic Interventions/Progress Updates:    pt received in chair and agreeable to therapy with girlfriend present. Pt requesting to go outside for session, nursing made aware and agreeable. Pt transported in [facility] with girlfriend present off unit to outside area for time management. Pt directed in WC mobility on uneven surfaces outside and with multiple directional changes, start/stops with use of LLE and LUE for propelling, initially 70' mod A for navigational changes and starting forward propulsion, improved to min A for additional 75' but required prolonged rest break. Pt and pt's girlfriend educated on safety and technique with WC mobility outside and to encourage rest breaks as needed for energy conservation. Pt denied pain throughout session and at start and end however did report fatigue with outdoor WC mobility but improved post rest breaks. Pt directed in additional 44' x2 with prolonged rest break between and final 100' with forward propulsion, weaving between obstacles, x4 start/stops, directional changes in 90 degrees turns x2 and 180 degree turn x1 with mod A for 180 and min A -CGA throughout this session. PT managing wound vac throughout. Pt directed in slide board transfer to bed from [facility] at min A grossly, pt able to attempt at placing board however unable from sitting in WC position and required mod A to place board; to slide to bed, min A. Pt directed in sitting EOB for 15 mins dynamically for functional tasks of cleaning face and neck, rest break and requesting to be instructed on hamstring stretches, pt and girlfriend educated on visual demonstration and pt attempted mild  hamstring stretch at bedside 3x20s on LLE, maintaining WB status on LLE, pt reported feeling much better and requested to return to supine. Sit>supine mod  A for RLE management onto bed and placement into midline. Pt left with girlfriend present, alarm set, All needs in reach and in good condition. Call light in hand.    Therapy Documentation Precautions:  Precautions Precautions: Fall Precaution Booklet Issued: No Precaution Comments: wound vac Required Braces or Orthoses: Splint/Cast Splint/Cast: Rt post op cast/splint Restrictions Weight Bearing Restrictions: Yes RUE Weight Bearing: Weight bear through elbow only RLE Weight Bearing: Non weight bearing LLE Weight Bearing: Weight bearing as tolerated General:   Vital Signs:   Pain: Pain Assessment Pain Scale: 0-10 Pain Score: 6  Pain Type: Acute pain;Surgical pain Pain Location: Arm Pain Orientation: Right Pain Descriptors / Indicators: Aching Pain Frequency: Constant Pain Onset: On-going Patients Stated Pain Goal: 4 Pain Intervention(s): Medication (See eMAR) (oxycodone) Mobility:   Locomotion :    Trunk/Postural Assessment :    Balance:   Exercises:   Other Treatments:      Therapy/Group: Individual Therapy  Barbaraann Faster 04/09/2020, 3:02 PM
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
## 2020-04-09 NOTE — Progress Notes (Signed)
Physical Therapy Session Note  Patient Details  Name: Nicholas Randall MRN: [id number] Date of Birth: 1991/04/30  Today's Date: 04/09/2020     Short Term Goals: Week 1:  PT Short Term Goal 1 (Week 1): STG=LTG secondary to length of stay  Skilled Therapeutic Interventions/Progress Updates:   Pt off unit of X-ray. Will re-attempt at later time/date, provided pt is medically stable.      Therapy Documentation Precautions:  Precautions Precautions: Fall Precaution Booklet Issued: No Precaution Comments: wound vac Required Braces or Orthoses: Splint/Cast Splint/Cast: Rt post op cast/splint Restrictions Weight Bearing Restrictions: Yes RUE Weight Bearing: Weight bear through elbow only RLE Weight Bearing: Non weight bearing LLE Weight Bearing: Weight bearing as tolerated General:  missed time: : off unit.    Therapy/Group: Individual Therapy  Golden Pop 04/09/2020, 10:52 AM
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
## 2020-04-09 NOTE — Progress Notes (Signed)
Orthopedic Tech Progress Note Patient Details:  Nicholas Randall 1990-10-17 [id number] Called in order to [employer] for a FOREARM SPLINT Patient ID: [name], male   DOB: 17-May-1991, 29 y.o.   MRN: [id number]   Donald Pore 04/09/2020, 9:47 AM

## 2020-04-09 NOTE — Progress Notes (Signed)
Physical Therapy Session Note  Patient Details  Name: Nicholas Randall MRN: [id number] Date of Birth: [birth year]  Today's Date: 04/09/2020 PT Individual Time: 0800-0905 PT Individual Time Calculation (min): 65 min   Short Term Goals: Week 1:  PT Short Term Goal 1 (Week 1): STG=LTG secondary to length of stay  Skilled Therapeutic Interventions/Progress Updates:    pt received in bed and agreeable to therapy. Pt directed in bed mobility from supine>sit at min A with VC for technique and to maintain WB status in RUE and RLE with pt able to correct. Pt directed in EOB sitting within WB status for 5 mins for donning shirt and shoe with total assist for time management. Pt directed in slide board transfer to [facility] at min A for transfer and mod A for board placement and setup; pt able to remain in WB status to complete transfer to pt's L. Pt taken to gym in [facility] for time management and directed in [facility] management once there directing pt to utilize LUE and LLE to mobilize WC forward for 50' initially at min A for navigation; rest break then additional 63' with one turn and min A for turns with VC for tech, PT managing wound vac; prolonged rest break after this rep and pt directed in additional 100' forward self propulsion at min A for navigation and VC for tech and two turns. Pt directed in slide board transfer to mat table from [facility] and in sitting at [facility] of mat pt directed in 2x10 DF/PF, knee extension/flexion, and slight hip flexion on R and LLE within pt's tolerance. Pt reported pain with hip flexion on first two reps of RLE however reported no additional pain and then improved with rest as needed. Pt directed in slide board to return to [facility] at min A with family present throughout session and mother and girlfriend asked multiple questions about WC parts, use, and tech to assist pt once home. All answered and both reported understanding and very motivated to assist as needed. Pt's mother requested to assist pt in slide  board transfer to [facility] from mat table, with PT providing close supervision and VC for tech of pt and pt's mother, mother demonstrated ability to assist and ensure pt not breaking weight bearing status for slide board transfer. Pt taken back to room in [facility] for time management and requested to return to bed, pt directed in slide board transfer to bed at min A for transfer and PT provided min A for placement of board, VC for tech. Once sitting EOB, pt able to transfer to supine at mod A for RLE management onto bed and into midline for improved positioning. Pt left in bed, family present, alarm set, All needs in reach and in good condition. Call light in hand.    Therapy Documentation Precautions:  Precautions Precautions: Fall Precaution Booklet Issued: No Precaution Comments: wound vac Required Braces or Orthoses: Splint/Cast Splint/Cast: Rt post op cast/splint Restrictions Weight Bearing Restrictions: Yes RUE Weight Bearing: Weight bear through elbow only RLE Weight Bearing: Non weight bearing LLE Weight Bearing: Weight bearing as tolerated  Pain: pt denied pain at start and end of session.    Therapy/Group: Individual Therapy  Barbaraann Faster 04/09/2020, 12:41 PM

## 2020-04-09 NOTE — Progress Notes (Signed)
Occupational Therapy Session Note  Patient Details  Name: Nicholas Randall MRN: [id number] Date of Birth: 22-Sep-1990  Today's Date: 04/09/2020 OT Individual Time: 3335-4562 and 1320-1444 OT Individual Time Calculation (min): 53 min and 24 mins   Short Term Goals: Week 1:  OT Short Term Goal 1 (Week 1): STGs=LTGs secodary to estimated short LOS  Skilled Therapeutic Interventions/Progress Updates:    Session 1: Upon entering the room, pt supine in bed with significant other present. Pt agreeable to OT intervention and reports needing to have BM. Pt performed bed mobility with min A to EOB. OT set up equipment needs and pt performs slide board transfer from bed >drop arm commode chair with mod A. OT educating caregiver on set up of slide board and she was able to place for transfers. Pt leaning to the R onto elbow for clothing management and then to the L as well with total A from caregiver to pull pants down. Pt able to have successful BM but unable to wipe self secondary to body habitus. Caregiver placing slide board again and transfers from drop arm commode >wheelchair with mod A. Pt positioned in wheelchair at sink for hand hygiene and remains in wheelchair for lunch. All needs within reach.   Session 2: Upon entering the room, pt reports discomfort with R elevating leg rest which therapist adjusted for comfort. Pt verbalized increased pain of 6/10 with RN notified and pain medication given this session. Pt with new brace on R wrist but it appears very tight. OT encouraged pt to perform finger flex/ext exercises for edema and to elevate as often as possible. Pt verbalized understanding. Pt discussed adjustments for splint and OT scheduled that to happen when therapist next available. Pt remains up in wheelchair for next session. All needs within reach.   Therapy Documentation Precautions:  Precautions Precautions: Fall Precaution Booklet Issued: No Precaution Comments: wound vac Required Braces  or Orthoses: Splint/Cast Splint/Cast: Rt post op cast/splint Restrictions Weight Bearing Restrictions: Yes RUE Weight Bearing: Weight bear through elbow only RLE Weight Bearing: Non weight bearing LLE Weight Bearing: Weight bearing as tolerated   Pain: Pain Assessment Pain Scale: 0-10 Pain Score: 6  Pain Type: Acute pain;Surgical pain Pain Location: Arm Pain Orientation: Right Pain Descriptors / Indicators: Aching Pain Frequency: Constant Pain Onset: On-going Patients Stated Pain Goal: 4 Pain Intervention(s): Medication (See eMAR) (tramadol given)     Therapy/Group: Individual Therapy  Alen Bleacher 04/09/2020, 1:10 PM

## 2020-04-09 NOTE — Progress Notes (Signed)
Verbal order given from Jacalyn Lefevre for, PO Benadryl 12.5-25 mg PRN
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
## 2020-04-10 ENCOUNTER — Inpatient Hospital Stay (HOSPITAL_COMMUNITY): Payer: PRIVATE HEALTH INSURANCE | Admitting: Physical Therapy

## 2020-04-10 ENCOUNTER — Inpatient Hospital Stay (HOSPITAL_COMMUNITY): Payer: PRIVATE HEALTH INSURANCE | Admitting: Occupational Therapy

## 2020-04-10 MED ORDER — METHYLPREDNISOLONE 4 MG PO TBPK
4.0000 mg | ORAL_TABLET | ORAL | Status: AC
  Administered 2020-04-10: 4 mg via ORAL
  Filled 2020-04-10: qty 21

## 2020-04-10 MED ORDER — METHYLPREDNISOLONE 4 MG PO TBPK
8.0000 mg | ORAL_TABLET | Freq: Every evening | ORAL | Status: AC
  Administered 2020-04-11: 8 mg via ORAL

## 2020-04-10 MED ORDER — METHYLPREDNISOLONE 4 MG PO TBPK
8.0000 mg | ORAL_TABLET | Freq: Every morning | ORAL | Status: AC
  Administered 2020-04-10: 8 mg via ORAL
  Filled 2020-04-10: qty 21

## 2020-04-10 MED ORDER — METHYLPREDNISOLONE 4 MG PO TBPK
4.0000 mg | ORAL_TABLET | Freq: Four times a day (QID) | ORAL | Status: AC
  Administered 2020-04-12 – 2020-04-15 (×10): 4 mg via ORAL

## 2020-04-10 MED ORDER — METHYLPREDNISOLONE 4 MG PO TBPK
4.0000 mg | ORAL_TABLET | ORAL | Status: AC
  Administered 2020-04-10: 4 mg via ORAL

## 2020-04-10 MED ORDER — METHYLPREDNISOLONE 4 MG PO TBPK
4.0000 mg | ORAL_TABLET | Freq: Three times a day (TID) | ORAL | Status: AC
  Administered 2020-04-11 (×3): 4 mg via ORAL
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
## 2020-04-10 NOTE — Progress Notes (Signed)
Occupational Therapy Session Note  Patient Details  Name: Nicholas Randall MRN: [id number] Date of Birth: 1990/10/03  Today's Date: 04/10/2020 OT Individual Time: 4163-8453 OT Individual Time Calculation (min): 55 min    Short Term Goals: Week 1:  OT Short Term Goal 1 (Week 1): STGs=LTGs secodary to estimated short LOS  Skilled Therapeutic Interventions/Progress Updates:    Upon entering the room, pt supine in bed with significant other and mother present this session. Pt agreeable to OT intervention. Pt washing LB from bed level with set up A. He was able to wash down to B ankles and rolled with min A and able to wash buttocks with L UE. OT demonstrated use of reacher to thread pants onto feet. Pt returning demonstration successfully and able to don short with assist needed to pull over R hip only. Total A to don B socks and shoes. Supine >sit with min A to EOB. OT educated pt's mother on  slide board placement and she placed board as well as steady equipment and pt performing transfer to the L with min A. Pt seated at sink with family for UB self care as therapist exits the room. All needs within reach.   Therapy Documentation Precautions:  Precautions Precautions: Fall Precaution Booklet Issued: No Precaution Comments: wound vac Required Braces or Orthoses: Splint/Cast Splint/Cast: Rt post op cast/splint Restrictions Weight Bearing Restrictions: Yes RUE Weight Bearing: Weight bear through elbow only RLE Weight Bearing: Non weight bearing LLE Weight Bearing: Weight bearing as tolerated   Pain: Pain Assessment Pain Scale: 0-10 Pain Score: 2  Pain Location: Abdomen Pain Orientation: Mid Pain Descriptors / Indicators: Aching Pain Frequency: Intermittent Pain Onset: On-going Patients Stated Pain Goal: 4   Therapy/Group: Individual Therapy  Alen Bleacher 04/10/2020, 11:44 AM

## 2020-04-10 NOTE — Progress Notes (Signed)
Physical Therapy Session Note  Patient Details  Name: Nicholas Randall MRN: [id number] Date of Birth: [birth year]  Today's Date: 04/10/2020 PT Individual Time: 1345-1455 and 0910am-1007am PT Individual Time Calculation (min): 70 min and 57 mins  Short Term Goals: Week 1:  PT Short Term Goal 1 (Week 1): STG=LTG secondary to length of stay  Skilled Therapeutic Interventions/Progress Updates:   session one: pt received in [facility] and agreeable to therapy, mother present. Pt denied pain at start and end of session. Pt taken to gym to time management. Pt directed in dynamic sitting balance and core strengthening activities with use of LUE and LLE for functional reaching with reacher and LUE for objects outside of BOS from various heights including overhead and from ground level 2x15 at min A initially with VC for instruction and technique however pt improved to CGA for reaching and intermittent min A for directional changes in WC; additional reaching outside of BOS with use of LUE and reacher with x30 total objects throughout section of gym for improved endurance, safety awareness with WC mobility, WB status in RUE and RLE with pt requiring VC to remain within, and core strength for improved technique with mobility within current status, grossly CGA for reaching. Pt directed in LUE dynamic strengthening and endurance training with basketball shooting to hoop at top height at min A for righting in WC, 2x15 with pt instructed to mobilize [facility] for ball retrieval and different shooting start positions. Pt directed in [facility] mobility to return to room with use of LLE/UE for propulsion, PT managing wound vac at min A. Pt directed in seated LLE strengthening exercises in WC, once in room of 2x20 hip flexion to slightly above neutral; knee extension, knee flexion, hip abduction and adduction to neutral; RLE 2x10 hip adduction to neurtral and abduction, knee extension, knee flexion. Pt denied pain with this. Pt left in room;  girlfriend present, All needs in reach and in good condition. Call light in hand.    Session two: pt received in bed and agreeable to therapy. Pt directed in bed mobility to sitting EOB with min A for safety and VC for remaining in WB status in RUE and RLE, sat EOB for several minutes at CGA with VC for not pushing off of RLE to adjust position. Pt directed in slide board transfer to [facility] minA and required mod A for placement of board, pt able to shift and lean for placement but unable to get position fully in place, girlfriend present and placed board accurately with VC. Pt taken to gym in [facility] for time management. Once in gym pt directed object retrieval x10 throughout gym with pt self propelling WC between objects at min A for navigation, VC for reaching outside BOS for safety in WC, CGA for safety. Pt directed in x10object navigation with increase in challenge of object and placement for added challenge of reaching distances,object weight and WC navigation, CGA. Pt directed in seated static stretching for 10 mins or pt comfort and increased mobility with activities. Pt directed in 2x20s trunk lateral stretches, trunk extension and flexion based stretches to pt's comfort; hamstring stretches. Pt reported feeling "great" after this. Pt directed in seated LLE strengthening ex with 8# of bicep curls, shoulder flexion, shoulder abduction with elbow flexion and retractions x20 each. Pt requested to mobilize RUE at shoulder only without weight and able to demonstrate ability to complete without breaking WB status, no pain reported and completed same exercises for mobility of RUE. Pt returned to  room with self propeling the WC at min A for smaller areas and navigation, slide board to bed, sit>supine min A grossly. Pt left in room with girlfriend and nursing present, All needs in reach and in good condition. Call light in hand.   Alarm set.  Therapy Documentation Precautions:  Precautions Precautions: Fall Precaution  Booklet Issued: No Precaution Comments: wound vac Required Braces or Orthoses: Splint/Cast Splint/Cast: Rt post op cast/splint Restrictions Weight Bearing Restrictions: Yes RUE Weight Bearing: Weight bear through elbow only RLE Weight Bearing: Non weight bearing LLE Weight Bearing: Weight bearing as tolerated     Therapy/Group: Individual Therapy  Barbaraann Faster 04/10/2020, 4:10 PM

## 2020-04-11 ENCOUNTER — Inpatient Hospital Stay (HOSPITAL_COMMUNITY): Payer: PRIVATE HEALTH INSURANCE | Admitting: Occupational Therapy

## 2020-04-11 ENCOUNTER — Inpatient Hospital Stay (HOSPITAL_COMMUNITY): Payer: PRIVATE HEALTH INSURANCE | Admitting: Physical Therapy

## 2020-04-11 DIAGNOSIS — S32810D Multiple fractures of pelvis with stable disruption of pelvic ring, subsequent encounter for fracture with routine healing: Secondary | ICD-10-CM | POA: Diagnosis not present

## 2020-04-11 MED ORDER — PREGABALIN 75 MG PO CAPS
200.0000 mg | ORAL_CAPSULE | Freq: Two times a day (BID) | ORAL | Status: DC
  Administered 2020-04-11 – 2020-04-13 (×4): 200 mg via ORAL
  Filled 2020-04-11 (×2): qty 1
  Filled 2020-04-11: qty 2
  Filled 2020-04-11: qty 1
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
## 2020-04-11 NOTE — Progress Notes (Signed)
Physical Therapy Session Note  Patient Details  Name: Nicholas Randall MRN: [id number] Date of Birth: 05-21-1991  Today's Date: 04/11/2020 PT Individual Time: 0915-1027 PT Individual Time Calculation (min): 72 min   Short Term Goals: Week 1:  PT Short Term Goal 1 (Week 1): STG=LTG secondary to length of stay  Skilled Therapeutic Interventions/Progress Updates:    pt received in [facility] and agreeable to therapy; family present. Pt directed in safety with functional tasks to remain in WB status of RUE with face washing, teeth brushing, and deodorant placement as pt demonstrated attempts intermittently to push on armrest of WC to adjust or participate with tasks, pt educated to importance of remaining in [facility], pt agreed. Pt directed in [facility] mobility for 50' to elevator as pt requested to have session outside. Nursing aware and agreeable. Pt taken outside with [facility] for time management. Once outside on uneven surfaces pt directed in forward propulsion with LUE and LLE with PT to assist with wound vac management, completed 100' x2 min A x1 with pt demonstrating improved control with uneven surfaces and with turns; intermittent rest breaks needed 2/2 fatigue and "tightness" felt in L hamstring. Pt directed in stretching from [facility] level for 2x20s of trunk rotations to R/L, extension, flexion, B hamstring, and L hip flexion with pt reporting feeling much better overall and reports he had a great night last night and slept well and that wound vac sight feels much better compared to previous days. Pt directe din additional 75' min A in WC with similar cues and assist levels. Pt taken by PT back to room in [facility] for time management and left in room in [facility], girlfriend present, All needs in reach and in good condition. Call light in hand.  Pt denied pain at start and end of session.  Therapy Documentation Precautions:  Precautions Precautions: Fall Precaution Booklet Issued: No Precaution Comments: wound vac Required Braces or  Orthoses: Splint/Cast Splint/Cast: Rt post op cast/splint Restrictions Weight Bearing Restrictions: Yes RUE Weight Bearing: Weight bear through elbow only RLE Weight Bearing: Non weight bearing LLE Weight Bearing: Weight bearing as tolerated    Therapy/Group: Individual Therapy  Barbaraann Faster 04/11/2020, 10:59 AM

## 2020-04-11 NOTE — Progress Notes (Signed)
A&Ox4. Family at bedside during shift, supportive and eager to be involved in care. Wound vac intact and functioning to lower abdomen. Medicated for pain this shift. Attended all therapies as scheduled.

## 2020-04-11 NOTE — Progress Notes (Signed)
Physical Therapy Session Note  Patient Details  Name: Nicholas Randall MRN: [id number] Date of Birth: 23-Aug-1991  Today's Date: 04/11/2020 PT Individual Time: 1645-1730 PT Individual Time Calculation (min): 45 min   Short Term Goals: Week 1:  PT Short Term Goal 1 (Week 1): STG=LTG secondary to length of stay  Skilled Therapeutic Interventions/Progress Updates:   Pt received supine in bed and agreeable to PT. Supine>sit transfer without assist and or cues. SB transfer to [facility] with min assist from PT to stabilize SB. WC mobility through hall with hemi technique x 181f and 1551fwith cues for doorway management into orthogym. Pt instructed in car transfer with SB and min assist to stabilize board as well as education to SO for safe set up and recommendation to use sedan for d/c home. PT educated pt and SO on WC modification to remove wheel rims if needed to improve access to home as well as possible DME recommendation once pt able to stand. Pt returned to room and performed SB transfer to bed with min assist from SO . Sit>supine completed with supervision assist and safe LE use and to maintain NWB on the LLE. Pt left supine in bed with call bell in reach and all needs met.        Therapy Documentation Precautions:  Precautions Precautions: Fall Precaution Booklet Issued: No Precaution Comments: wound vac Required Braces or Orthoses: Splint/Cast Splint/Cast: Rt post op cast/splint Restrictions Weight Bearing Restrictions: Yes RUE Weight Bearing: Weight bearing as tolerated RLE Weight Bearing: Non weight bearing LLE Weight Bearing: Weight bearing as tolerated Vital Signs: Therapy Vitals Pulse Rate: 92 Resp: 18 BP: (!) 104/61 Patient Position (if appropriate): Lying Oxygen Therapy SpO2: 100 % O2 Device: Room Air Pain: Pain Assessment Pain Scale: 0-10 Pain Score: 4  pt repositioned.     Therapy/Group: Individual Therapy  AuLorie Phenix/30/2021, 5:53 PM

## 2020-04-11 NOTE — Progress Notes (Signed)
Occupational Therapy Session Note  Patient Details  Name: Nicholas Randall MRN: [id number] Date of Birth: 1990-10-26  Today's Date: 04/11/2020 OT Individual Time: 3496-1164 OT Individual Time Calculation (min): 42 min   Short Term Goals: Week 1:  OT Short Term Goal 1 (Week 1): STGs=LTGs secodary to estimated short LOS  Skilled Therapeutic Interventions/Progress Updates:    Pt greeted in the w/c, just had wound vac removed and very happy about this. No c/o pain, ADL needs met and he was ready to go. Started by discussing toileting at home. Pt reports the w/c will not be able to fit inside of his bathroom but that he will have a drop arm BSC provided by the hospital. Practiced slideboard transfers to and from [facility], his [place] placing board and providing appropriate safety cues as needed. Pt able to scoot across board with setup assist, mindful of his multiple precautions. Discussed adaptive methods for clothing mgt and hygiene, and we practiced these methods in a simulated manner during session. Also discussed WB through Rt elbow, Lt UE, and Lt LE for SO to assist with clothing mgt if needed. Next practiced slideboard transfers<bed<drop arm BSC. Pt and SO required min cuing for setup and technique initially, fading to independent level with repetition. Cleared SO to assist with toilet and bed transfers using the slideboard on safety plan. Notified nursing staff. At end of session pt remained sitting EOB with all needs within reach.    Therapy Documentation Precautions:  Precautions Precautions: Fall Precaution Booklet Issued: No Precaution Comments: wound vac Required Braces or Orthoses: Splint/Cast Splint/Cast: Rt post op cast/splint Restrictions Weight Bearing Restrictions: Yes RUE Weight Bearing: Weight bear through elbow only RLE Weight Bearing: Non weight bearing LLE Weight Bearing: Weight bearing as tolerated Pain: Pain Assessment Pain Scale: 0-10 Pain Score: 2  ADL:        Therapy/Group: Individual Therapy  Nicholas Randall A Nicholas Randall 04/11/2020, 12:44 PM

## 2020-04-12 ENCOUNTER — Inpatient Hospital Stay (HOSPITAL_COMMUNITY): Payer: PRIVATE HEALTH INSURANCE

## 2020-04-12 ENCOUNTER — Inpatient Hospital Stay (HOSPITAL_COMMUNITY): Payer: PRIVATE HEALTH INSURANCE | Admitting: Occupational Therapy

## 2020-04-12 ENCOUNTER — Inpatient Hospital Stay (HOSPITAL_COMMUNITY): Payer: PRIVATE HEALTH INSURANCE | Admitting: Physical Therapy

## 2020-04-12 DIAGNOSIS — S32810D Multiple fractures of pelvis with stable disruption of pelvic ring, subsequent encounter for fracture with routine healing: Secondary | ICD-10-CM | POA: Diagnosis not present

## 2020-04-12 NOTE — Progress Notes (Signed)
Physical Therapy Session Note  Patient Details  Name: Nicholas Randall MRN: [id number] Date of Birth: 01-07-1991  Today's Date: 04/12/2020 PT Individual Time: 0830-0931 PT Individual Time Calculation (min): 61 min   Short Term Goals: Week 1:  PT Short Term Goal 1 (Week 1): STG=LTG secondary to length of stay    Skilled Therapeutic Interventions/Progress Updates:    pt received in bed and agreeable to therapy. Pt directed in supine>sit with HOB slightly elevated CGA with VC for WB status on RLE as pt attempts to push self in bed with this LE, educated this is also breaking WB status, pt agreed. Pt sat EOB for upper body dressing and functional tasks for 8 mins at supervision within WB status; slide board transfer to [facility] from pt's L, pt's girlfriend present and placement the board, pt transferred safely to WC at min A for WC setup, slide board setup, completing transfer over threshold of WC for improved positioning on board and into WC. Pt directed in functional tasks in room for face washing, teeth brushing, and deoderant, with this made therapeutic with having items outside of BOS and pt reaching in various directions and heights to retreive while remaining in WB status of UE and LE, grossly CGA. Pt taken to gym in [facility] for time management. Pt directed in car transfer x2 with slide board from [facility] with 19.5 in height used per chart for car height, at mod A on first attempt and improved to min A on second rep. Pt and girlfriend education on setup of board, WC placement, and technique for safety, pt able to complete with good safety awareness overall however demonstrated breaking of WB status to slide fully into seat for repositioning, pt educated on this and did not break on second rep. Pt reports he feels much more confident about this transfer. Pt directed in WC mobility in gy, for object retrieval of various hand weights from various heights and reaching distances, x10 total with pt able to retrieve at min A  for smaller area WC navigation. Pt self propelled WC to room 150' CGA. Pt left sitting in WC, girlfriend present, All needs in reach and in good condition. Call light in hand.     Therapy Documentation Precautions:  Precautions Precautions: Fall Precaution Booklet Issued: No Precaution Comments: wound vac Required Braces or Orthoses: Splint/Cast Splint/Cast: Rt post op cast/splint Restrictions Weight Bearing Restrictions: Yes RUE Weight Bearing: Weight bearing as tolerated RLE Weight Bearing: Non weight bearing LLE Weight Bearing: Weight bearing as tolerated   Therapy/Group: Individual Therapy  Barbaraann Faster 04/12/2020, 10:30 AM
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
## 2020-04-12 NOTE — Progress Notes (Signed)
Occupational Therapy Session Note  Patient Details  Name: Nicholas Randall MRN: [id number] Date of Birth: [birth year]  Today's Date: 04/12/2020 OT Individual Time: 1430-1545 OT Individual Time Calculation (min): 75 min    Short Term Goals: Week 1:  OT Short Term Goal 1 (Week 1): STGs=LTGs secodary to estimated short LOS  Skilled Therapeutic Interventions/Progress Updates:     1:1. Pt received in w/c with family present and mother wanting to go over SB transfers to car simulator. Pt and OT complete car transfer with MIN A and OT demo board placement, cuing and providing support at hips PRN for transfer. Pt demo good adherence ot WB precautions. Pt and mother complete in same manner with no cuing needed! Pt completes IADL tasks with supervision as follows with VC for strategizing how to place w/c in proximity to tools and appliances: reaching for objects in kitchen from various heights, loading washer/dryer, and vacuuming ADL apartment. Pt and family provided handout on energy conservation and discussed adaptations for higher level tasks. Pt verbalized understanding and appreciative of education. Exited session with pt seated in bed, exit alarm on and call light in reach  Therapy Documentation Precautions:  Precautions Precautions: Fall Precaution Booklet Issued: No Precaution Comments: wound vac Required Braces or Orthoses: Splint/Cast Splint/Cast: Rt post op cast/splint Restrictions Weight Bearing Restrictions: Yes RUE Weight Bearing: Weight bearing as tolerated RLE Weight Bearing: Non weight bearing LLE Weight Bearing: Weight bearing as tolerated General:   Vital Signs: Therapy Vitals Pulse Rate: 102 Resp: 18 BP: (!) 144/78 Patient Position (if appropriate): Lying Oxygen Therapy SpO2: 95 % O2 Device: Room Air Pain:   ADL:   Vision   Perception    Praxis   Exercises:   Other Treatments:     Therapy/Group: Individual Therapy  Shon Hale 04/12/2020, 4:01  PM

## 2020-04-13 ENCOUNTER — Inpatient Hospital Stay (HOSPITAL_COMMUNITY): Payer: PRIVATE HEALTH INSURANCE | Admitting: Occupational Therapy

## 2020-04-13 DIAGNOSIS — S32810D Multiple fractures of pelvis with stable disruption of pelvic ring, subsequent encounter for fracture with routine healing: Secondary | ICD-10-CM | POA: Diagnosis not present

## 2020-04-13 MED ORDER — LIDOCAINE 5 % EX OINT
1.0000 "application " | TOPICAL_OINTMENT | Freq: Four times a day (QID) | CUTANEOUS | Status: DC | PRN
  Filled 2020-04-13: qty 35.44

## 2020-04-13 MED ORDER — PREGABALIN 75 MG PO CAPS
200.0000 mg | ORAL_CAPSULE | Freq: Three times a day (TID) | ORAL | Status: DC
  Administered 2020-04-13 – 2020-04-16 (×10): 200 mg via ORAL
  Filled 2020-04-13 (×9): qty 1
  Filled 2020-04-13: qty 2

## 2020-04-13 MED ORDER — DULOXETINE HCL 30 MG PO CPEP: 30.0000 mg | ORAL_CAPSULE | Freq: Every day | ORAL | Status: DC

## 2020-04-13 MED ORDER — DULOXETINE HCL 30 MG PO CPEP
30.0000 mg | ORAL_CAPSULE | Freq: Every day | ORAL | Status: AC
  Administered 2020-04-13 – 2020-04-16 (×4): 30 mg via ORAL
  Filled 2020-04-13 (×4): qty 1

## 2020-04-13 MED ORDER — DULOXETINE HCL 60 MG PO CPEP: 60.0000 mg | ORAL_CAPSULE | Freq: Every day | ORAL | Status: DC

## 2020-04-13 NOTE — Progress Notes (Signed)
Occupational Therapy Session Note  Patient Details  Name: Nicholas Randall MRN: [id number] Date of Birth: October 29, 1990  Today's Date: 04/13/2020 OT Individual Time: 1062-6948 OT Individual Time Calculation (min): 59 min   Short Term Goals: Week 1:  OT Short Term Goal 1 (Week 1): STGs=LTGs secodary to estimated short LOS  Skilled Therapeutic Interventions/Progress Updates:    Pt greeted in bed with no c/o pain, agreeable to OT. Started session by donning clothing bedlevel, pt requiring setup assist for UB/LB dressing with pt independently adhering to his WB precautions. No AE needed today. He donned sneakers while sitting EOB with Min A to tie laces. Fiance Kenatta assisted pt with slideboard<w/c exhibiting good safety and carryover of transfer education techniques. While seated at the sink, pt engaged in oral care/grooming tasks at Independent level. Discussed options at home for negotiating over 3 inch curb-step to get into his house. Educated pt on suitcase ramp option or bump up method, forward to get into home and backwards to get out of home. Pt opted for the bump up method, stating his brother and father would be able to assist him. Pt and Kenatta also requested to have a HEP for home use. OT printed them out a stretching/strengthening program and reviewed first half during our session, focusing on bilateral stretches of the shoulder with no forearm rotation for the Rt UE. Plans made to review second half of program tomorrow during scheduled OT. Left pt in the room with all needs within reach.    Therapy Documentation Precautions:  Precautions Precautions: Fall Precaution Booklet Issued: No Precaution Comments: wound vac Required Braces or Orthoses: Splint/Cast Splint/Cast: Rt post op cast/splint Restrictions Weight Bearing Restrictions: Yes RUE Weight Bearing: Touch down weight bearing RLE Weight Bearing: Non weight bearing LLE Weight Bearing: Weight bearing as tolerated ADL:        Therapy/Group: Individual Therapy  Laysa Kimmey A Kendel Bessey 04/13/2020, 12:15 PM

## 2020-04-13 NOTE — Plan of Care (Signed)
  Problem: Consults Goal: Skin Care Protocol Initiated - if Braden Score 18 or less Description: If consults are not indicated, leave blank or document N/A Outcome: Progressing   Problem: RH BOWEL ELIMINATION Goal: RH STG MANAGE BOWEL WITH ASSISTANCE Description: STG Manage Bowel with mod I Assistance. Outcome: Progressing Goal: RH STG MANAGE BOWEL W/MEDICATION W/ASSISTANCE Description: STG Manage Bowel with Medication with mod I Assistance. Outcome: Progressing   Problem: RH BLADDER ELIMINATION Goal: RH STG MANAGE BLADDER WITH ASSISTANCE Description: STG Manage Bladder With min Assistance Outcome: Progressing   Problem: RH SKIN INTEGRITY Goal: RH STG MAINTAIN SKIN INTEGRITY WITH ASSISTANCE Description: STG Maintain Skin Integrity With min Assistance. Outcome: Progressing Goal: RH STG ABLE TO PERFORM INCISION/WOUND CARE W/ASSISTANCE Description: STG Able To Perform Incision/Wound Care With min Assistance. Outcome: Progressing   Problem: RH SAFETY Goal: RH STG ADHERE TO SAFETY PRECAUTIONS W/ASSISTANCE/DEVICE Description: STG Adhere to Safety Precautions With cues and reminders Outcome: Progressing Goal: RH STG DECREASED RISK OF FALL WITH ASSISTANCE Description: STG Decreased Risk of Fall With cues and reminders Outcome: Progressing   Problem: RH PAIN MANAGEMENT Goal: RH STG PAIN MANAGED AT OR BELOW PT'S PAIN GOAL Description: Pain level less than 4 on scale of 0-10 Outcome: Progressing
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
## 2020-04-14 ENCOUNTER — Inpatient Hospital Stay (HOSPITAL_COMMUNITY): Payer: PRIVATE HEALTH INSURANCE | Admitting: Occupational Therapy

## 2020-04-14 ENCOUNTER — Inpatient Hospital Stay (HOSPITAL_COMMUNITY): Payer: PRIVATE HEALTH INSURANCE | Admitting: Physical Therapy

## 2020-04-14 DIAGNOSIS — S32810D Multiple fractures of pelvis with stable disruption of pelvic ring, subsequent encounter for fracture with routine healing: Secondary | ICD-10-CM | POA: Diagnosis not present

## 2020-04-14 LAB — CBC WITH DIFFERENTIAL/PLATELET
Abs Immature Granulocytes: 0.47 10*3/uL — ABNORMAL HIGH (ref 0.00–0.07)
Basophils Absolute: 0.1 10*3/uL (ref 0.0–0.1)
Basophils Relative: 1 %
Eosinophils Absolute: 0.2 10*3/uL (ref 0.0–0.5)
Eosinophils Relative: 1 %
HCT: 33.8 % — ABNORMAL LOW (ref 39.0–52.0)
Hemoglobin: 10.6 g/dL — ABNORMAL LOW (ref 13.0–17.0)
Immature Granulocytes: 3 %
Lymphocytes Relative: 20 %
Lymphs Abs: 3.3 10*3/uL (ref 0.7–4.0)
MCH: 26.5 pg (ref 26.0–34.0)
MCHC: 31.4 g/dL (ref 30.0–36.0)
MCV: 84.5 fL (ref 80.0–100.0)
Monocytes Absolute: 1.3 10*3/uL — ABNORMAL HIGH (ref 0.1–1.0)
Monocytes Relative: 8 %
Neutro Abs: 10.8 10*3/uL — ABNORMAL HIGH (ref 1.7–7.7)
Neutrophils Relative %: 67 %
Platelets: 391 10*3/uL (ref 150–400)
RBC: 4 MIL/uL — ABNORMAL LOW (ref 4.22–5.81)
RDW: 15 % (ref 11.5–15.5)
WBC: 16.1 10*3/uL — ABNORMAL HIGH (ref 4.0–10.5)
nRBC: 0.1 % (ref 0.0–0.2)

## 2020-04-14 LAB — COMPREHENSIVE METABOLIC PANEL
ALT: 73 U/L — ABNORMAL HIGH (ref 0–44)
AST: 27 U/L (ref 15–41)
Albumin: 3.1 g/dL — ABNORMAL LOW (ref 3.5–5.0)
Alkaline Phosphatase: 75 U/L (ref 38–126)
Anion gap: 10 (ref 5–15)
BUN: 13 mg/dL (ref 6–20)
CO2: 23 mmol/L (ref 22–32)
Calcium: 9 mg/dL (ref 8.9–10.3)
Chloride: 105 mmol/L (ref 98–111)
Creatinine, Ser: 0.73 mg/dL (ref 0.61–1.24)
GFR calc Af Amer: >60 mL/min (ref >60)
GFR calc non Af Amer: >60 mL/min (ref >60)
Glucose, Bld: 104 mg/dL — ABNORMAL HIGH (ref 70–99)
Potassium: 4.4 mmol/L (ref 3.5–5.1)
Sodium: 138 mmol/L (ref 135–145)
Total Bilirubin: 0.6 mg/dL (ref 0.3–1.2)
Total Protein: 6.5 g/dL (ref 6.5–8.1)

## 2020-04-14 NOTE — Progress Notes (Signed)
Physical Therapy Session Note  Patient Details  Name: Nicholas Randall MRN: [id number] Date of Birth: [birth year]  Today's Date: 04/14/2020 PT Individual Time: 1142-1205 PT Individual Time Calculation (min): 23 min   and  Today's Date: 04/14/2020 PT Missed Time: 22 Minutes Missed Time Reason: Other (Comment)  Short Term Goals: Week 1:  PT Short Term Goal 1 (Week 1): STG=LTG secondary to length of stay  Skilled Therapeutic Interventions/Progress Updates:    Pt received sitting in w/c with his mother and fiance present and pt on the phone speaking with insurance company and DME company - pt very politely and apologetically requests time to complete these phone calls to ensure everything is prepared for his upcoming D/C as he has been on hold for a long while. Therapist ensured Nicholas Randall, SW aware to provide any additional assistance as needed. Missed 22 minutes of skilled physical therapy.   Once phone calls complete pt agreeable to therapy session. Therapist received some updates from Nicholas Randall, SW and educated pt on original recommendation for follow-up HHPT but due to limited availability that may not be possible. Therefore, discussed importance of providing pt thorough LE HEP and importance of being dedicated to performing it upon D/C and then advance to OPPT once LE WBing restrictions are upgraded - pt in agreement with this plan. Therapist educated pt on importance of lying flat every couple of hours at discharge to stretch hip flexors since he will be spending majority of the day in wheelchair. Therapist created HEP with the following exercises and provided printout as well as visual cuing for proper form and education on likely assist to complete some exercises due to pain. Due to time limitations unable to perform exercises today. HEP for LEs 2x10 reps each LE: - seated long arc quads - supine heel slides - supine quad sets - supine short arc quad  - supine straight leg raise (educated pt that  this is a progression from the previous 2 exercises and will likely require assistance) - supine hip adduction squeeze  - supine hip abduction Pt denies any questions about exercises. Pt reports noticing his vision has been blurry recently - Dr. Wynn Banker notified. Pt left seated in w/c with needs in reach and family present.  Therapy Documentation Precautions:  Precautions Precautions: Fall Precaution Booklet Issued: No Precaution Comments: wound vac Required Braces or Orthoses: Splint/Cast Splint/Cast: Rt post op cast/splint Restrictions Weight Bearing Restrictions: Yes RUE Weight Bearing: Touch down weight bearing RLE Weight Bearing: Non weight bearing LLE Weight Bearing: Weight bearing as tolerated  Pain:   No reports of pain throughout session.   Therapy/Group: Individual Therapy  Nicholas Randall , PT, DPT, CSRS  04/14/2020, 8:01 AM

## 2020-04-14 NOTE — Progress Notes (Signed)
Occupational Therapy Session Note  Patient Details  Name: Anant Agard MRN: [id number] Date of Birth: Aug 18, 1991  Today's Date: 04/14/2020 OT Individual Time: 0930-1030 OT Individual Time Calculation (min): 60 min    Short Term Goals: Week 1:  OT Short Term Goal 1 (Week 1): STGs=LTGs secodary to estimated short LOS  Skilled Therapeutic Interventions/Progress Updates:  Patient met lying supine in bed with mother and fiance providing assist for bathing/dressing at bed level with set-up. Patient demonstrated lateral scoot transfer to [facility] on L with Min A. Patient/family asked this OT to step out of the room while patient completed toileting. Lateral scoot to L with slide board to wc in prep for grooming tasks seated at sink level with patient completing oral hygiene with independence. OT provided patient/family education on completing BADLs as independently as possible, community re-integration including problem solving toileting, and placement of DME at home to maximize safety and independence. Community wc mobility with focus on maneuvering wc in tight spaces in hospital gift shop. Session concluded with patient seated in wc with call bell within reach and family present at bedside.    Therapy Documentation Precautions:  Precautions Precautions: Fall Precaution Booklet Issued: No Precaution Comments: wound vac Required Braces or Orthoses: Splint/Cast Splint/Cast: Rt post op cast/splint Restrictions Weight Bearing Restrictions: Yes RUE Weight Bearing: Touch down weight bearing RLE Weight Bearing: Non weight bearing LLE Weight Bearing: Weight bearing as tolerated General:    Therapy/Group: Individual Therapy  Braelen Sproule R Howerton-Davis 04/14/2020, 9:45 AM

## 2020-04-14 NOTE — Progress Notes (Signed)
Occupational Therapy Discharge Summary  Patient Details  Name: Nicholas Randall MRN: [id number] Date of Birth: March 20, 1991  Patient has met 6 of 6 long term goals due to improved activity tolerance and ability to compensate for deficits.  Patient to discharge at overall Supervision-Min A level.  Patient's fiance and mother have participated in ongoing hands on family training throughout his hospital stay. Family demonstrates the ability to provide the necessary assistance at discharge.    All goals met.   Recommendation:  Patient will benefit from ongoing skilled OT services in [facility] or OP to continue to advance functional skills in the area of BADL, iADL and Vocation.  Equipment: hospital bed, drop arm BSC  Reasons for discharge: treatment goals met and discharge from hospital  Patient/family agrees with progress made and goals achieved: Yes  OT Discharge Precautions/Restrictions  Restrictions Weight Bearing Restrictions: Yes RUE Weight Bearing: Weight bear through elbow only RLE Weight Bearing: Non weight bearing LLE Weight Bearing: Weight bearing as tolerated General PT Missed Treatment Reason: Other (Comment) Vital Signs Therapy Vitals Pulse Rate: 81 Resp: 18 BP: 127/85 Patient Position (if appropriate): Sitting Oxygen Therapy SpO2: 100 % O2 Device: Room Air ADL ADL Grooming: Independent Where Assessed-Grooming: Sitting at sink Upper Body Bathing: Minimal assistance Where Assessed-Upper Body Bathing: Bed level Lower Body Bathing: Minimal assistance Where Assessed-Lower Body Bathing: Bed level Upper Body Dressing: Setup Where Assessed-Upper Body Dressing: Bed level Lower Body Dressing: Minimal assistance Where Assessed-Lower Body Dressing: Edge of bed, Bed level Toileting: Minimal assistance Where Assessed-Toileting: Bedside Commode Toilet Transfer: Minimal assistance Toilet Transfer Method: Theatre manager: Drop arm bedside  commode Tub/Shower Transfer: Not assessed Cognition Orientation Level: Oriented X4 Safety/Judgment: Appears intact Motor  Motor Motor: Within Functional Limits Mobility    Min A slideboard transfers to drop arm BSC Balance Balance Balance Assessed: Yes Dynamic Sitting Balance Dynamic Sitting - Level of Assistance: 7: Independent (donning shoes while sitting EOB) Extremity/Trunk Assessment RUE Assessment RUE Assessment: Exceptions to [facility] (no forearm rotation permitted due to radial fx/splint) Active Range of Motion (AROM) Comments: shoulder ROM WNL LUE Assessment LUE Assessment: Within Functional Limits   Michaela A Hoffman 04/14/2020, 3:57 PM

## 2020-04-14 NOTE — Progress Notes (Signed)
Occupational Therapy Session Note  Patient Details  Name: Nicholas Randall MRN: [id number] Date of Birth: 1991/08/19  Today's Date: 04/14/2020 OT Individual Time: 1415-1529 OT Individual Time Calculation (min): 74 min   Short Term Goals: Week 1:  OT Short Term Goal 1 (Week 1): STGs=LTGs secodary to estimated short LOS  Skilled Therapeutic Interventions/Progress Updates:    Pt greeted in bed with family present. He expressed some frustration regarding proceedings of d/c, including having all needed equipment delivered to his house and also having a fitted forearm splint for home. Per pt, medical team is supplying him with a dynamic splint on Wednesday. He was agreeable to tx this afternoon with pain reportedly manageable. Fiance assisted pt with slideboard<w/c and managed leg rests without cuing. Pt selected a #10 handweight from the gym and was then escorted to the outdoor patio area for psychosocial health. He engaged in the UE HEP provided by therapist with min cuing, using the handweight with the Lt hand and engaging Rt UE in AROM shoulder exercises only. Pt then completed his stretching portion of the exercise packet as well, family involved and participating by his side. Pt self propelled the w/c using his Lt UE/LE in the atrium, able to play the piano with both hands without rotating his Rt forearm. His affect visibly brightened at this time and a few people stopped to listen to him play. Pt was then escorted back to his room and remained sitting up in the w/c. All needs within reach.   Therapy Documentation Precautions:  Precautions Precautions: Fall Precaution Booklet Issued: No Precaution Comments: wound vac Required Braces or Orthoses: Splint/Cast Splint/Cast: Rt post op cast/splint Restrictions Weight Bearing Restrictions: Yes RUE Weight Bearing: Weight bear through elbow only RLE Weight Bearing: Non weight bearing LLE Weight Bearing: Weight bearing as tolerated Vital  Signs: Therapy Vitals Pulse Rate: 81 Resp: 18 BP: 127/85 Patient Position (if appropriate): Sitting Oxygen Therapy SpO2: 100 % O2 Device: Room Air ADL: ADL Grooming: Independent Where Assessed-Grooming: Sitting at sink Upper Body Bathing: Minimal assistance Where Assessed-Upper Body Bathing: Bed level Lower Body Bathing: Minimal assistance Where Assessed-Lower Body Bathing: Bed level Upper Body Dressing: Setup Where Assessed-Upper Body Dressing: Bed level Lower Body Dressing: Minimal assistance Where Assessed-Lower Body Dressing: Edge of bed, Bed level Toileting: Minimal assistance Where Assessed-Toileting: Bedside Commode Toilet Transfer: Minimal assistance Toilet Transfer Method: Scientist, research (life sciences): Drop arm bedside commode Tub/Shower Transfer: Not assessed      Therapy/Group: Individual Therapy  Kito Cuffe A Brigida Scotti 04/14/2020, 4:28 PM
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
## 2020-04-15 ENCOUNTER — Inpatient Hospital Stay (HOSPITAL_COMMUNITY): Payer: PRIVATE HEALTH INSURANCE | Admitting: Physical Therapy

## 2020-04-15 ENCOUNTER — Inpatient Hospital Stay (HOSPITAL_COMMUNITY): Payer: PRIVATE HEALTH INSURANCE | Admitting: Occupational Therapy

## 2020-04-15 MED ORDER — NORTRIPTYLINE HCL 10 MG PO CAPS
10.0000 mg | ORAL_CAPSULE | Freq: Every day | ORAL | Status: DC
  Administered 2020-04-15: 10 mg via ORAL
  Filled 2020-04-15 (×2): qty 1

## 2020-04-15 NOTE — Progress Notes (Signed)
Physical Therapy Discharge Summary  Patient Details  Name: Nicholas Randall MRN: [id number] Date of Birth: December 20, 1990  Today's Date: 04/15/2020 PT Individual Time: 1300-1346 and 0800-0903 PT Individual Time Calculation (min): 46 min and 63 mins  session 1: pt received in bed and agreeable to therapy with girlfriend present. Pt directed in supine >sit transfer to EOB at SBA with only one VC for RUE weight bearing restrictions. Pt sat EOB at SBA in good posture and position for several minutes for functional tasks of donning shirt and shoes with was min A for shirt and total for shoes for time management. Pt directed in slide board transfer to [facility] with girlfriend leading transfer, total A for board placement, setup for slide board to [facility] for stability and WC placement and VC for WB restrictions on RLE. Pt able to correct. Pt taken to gym in [facility] for time management. Pt directed in x4 slide board transfers to/from WC/car simulation for increased I with car transfer at home, min A initially with VC for technique and to remain in WB status. Pt able to correct with cues and remain in WB status, girlfriend educated on technique and reported feeling comfortable about this and assisted pt with this transfer as well, setup for transfer after reps and assist for board placement. Pt directed in seated BLE strengthening exercises with 2.5# on RLE and 4# on LLE for marching, LAQ, hip add/abudction 2x20 with VC for technique and to remain In WB status which pt able to complete. No complaints of pain. Pt directed in WC mobility 200' CGA for small area turns and more narrow spaces. Pt left in room in [facility] with girlfriend present, All needs in reach and in good condition. Call light in hand.    Session 2: pt received in bed and agreeable to therapy with girlfriend present. Pt and girlfriend educated and discussed DC plan with them and pt's mother via phone, pt reports hospital bed has been delivered and family currently working with  home setup for pt's needs. Pt reports he was given HEP yesterday but has not gone over this yet, pt given handout and directed in all exercises 2x20 with pt educated on proper technique and remaining in WB status on RLE with good follow through, pt reports having a good understanding of these. Pt directed supine>sit EOB setup from flat bed, in transfer with slide board to chair from EOB at setup for board placement. Pt directed in [place] mobility to gym 150' CGA with use of LUE and LLE only cues for narrow area turns.  Pt directed in seated BLE strengthening exercises with 3# on RLE and 5# on LLE for marching, LAQ, hip add/abudction 2x20 with VC for technique and to remain In WB status which pt able to complete. No complaints of pain.Pt taken to room in [facility] for time management, total A. Pt left in room with girlfriend present, All needs in reach and in good condition. Call light in hand.     Pt and girlfriend and pt's mother has been present throughout multiple PT sessions throughout stay and both educated on pt's level of assist, use of all equipment and mobility within WB status and on various surfaces through stay. All parties agreeable to all recommendations, safety, and understanding of slide board, hospital bed use, and WC parts and use.   Patient has met 4 of 4 long term goals due to improved activity tolerance, improved balance, improved postural control, increased strength, increased range of motion and decreased pain.  Patient to discharge at a wheelchair level Supervision.   Patient's care partner is independent to provide the necessary physical assistance at discharge.  Reasons goals not met: pt met all goals  Recommendation:  Patient will benefit from ongoing skilled PT services in outpatient setting to continue to advance safe functional mobility, address ongoing impairments related to WB status which is out of pt's control however is a limiting factor for pt functionally, and minimize fall  risk.  Equipment: manual WC (delievered to pt's room), hospital bed, slide board  Reasons for discharge: discharge from hospital  Patient/family agrees with progress made and goals achieved: Yes  PT Discharge Precautions/Restrictions Precautions Precautions: Fall Precaution Booklet Issued: No Required Braces or Orthoses: Splint/Cast Splint/Cast: Rt post op cast/splint Restrictions Weight Bearing Restrictions: Yes RUE Weight Bearing: Weight bearing as tolerated (through elbow only) RLE Weight Bearing: Non weight bearing LLE Weight Bearing: Weight bearing as tolerated (for transfers only) Vital Signs Therapy Vitals Pulse Rate: 93 Resp: 18 BP: (!) 139/93 Patient Position (if appropriate): Sitting Oxygen Therapy SpO2: 100 % O2 Device: Room Air Pain Pain Assessment Pain Scale: 0-10 Pain Score: 0-No pain Vision/Perception  Perception Perception: Within Functional Limits Praxis Praxis: Intact  Cognition Overall Cognitive Status: Within Functional Limits for tasks assessed Arousal/Alertness: Awake/alert Orientation Level: Oriented X4 Safety/Judgment: Appears intact Sensation Sensation Light Touch: Impaired Detail (pt reports tingling intermittently in L foot) Hot/Cold: Appears Intact Proprioception: Appears Intact Coordination Gross Motor Movements are Fluid and Coordinated: Yes Motor     Mobility Bed Mobility Bed Mobility: Rolling Right Rolling Right: Set up assist Transfers Transfers: Transfer Transfer (Assistive device): Other (Comment) (slide board) Locomotion  Gait Ambulation: No Gait Gait: No Stairs / Additional Locomotion Stairs: No Wheelchair Mobility Wheelchair Mobility: Yes Wheelchair Assistance: Engineer, maintenance (IT) Propulsion: Left lower extremity;Left upper extremity Wheelchair Parts Management: Needs assistance;Supervision/cueing Distance: 200  Trunk/Postural Assessment  Cervical Assessment Cervical Assessment:  Within Functional Limits Thoracic Assessment Thoracic Assessment: Within Functional Limits Lumbar Assessment Lumbar Assessment: Within Functional Limits  Balance Balance Balance Assessed: Yes Dynamic Sitting Balance Dynamic Sitting - Level of Assistance: 7: Independent Sitting balance - Comments: able to sit at foot of bed without PT/OT assist, EOB and at mat table tolerance x 10+minutes without use of UE for support Extremity Assessment      RLE Assessment RLE Assessment: Exceptions to [facility] Passive Range of Motion (PROM) Comments: WFL Active Range of Motion (AROM) Comments: [facility] Strength Comments: 4+/5 at hip flexion, knee flexion and extension, ankle DF/PF LLE Assessment LLE Assessment: Within Functional Limits    Junie Panning 04/15/2020, 3:07 PM

## 2020-04-15 NOTE — Progress Notes (Signed)
Occupational Therapy Session Note  Patient Details  Name: Nicholas Randall MRN: [id number] Date of Birth: 03-01-1991  Today's Date: 04/15/2020 OT Individual Time: 1415-1500 OT Individual Time Calculation (min): 45 min    Short Term Goals: Week 1:  OT Short Term Goal 1 (Week 1): STGs=LTGs secodary to estimated short LOS  Skilled Therapeutic Interventions/Progress Updates:    Pt sitting up in w/c, reporting discomfort from RUE splint due to multiple areas digging into skin.  Pt transported to small gym and custom orthosis remolded to improve positioning into neutral forearm and wrist and to increase pt comfort and reduce pressure areas to protect skin integrity.  Strap added to wrist to increase support and comfort.  Pt reports significantly improved comfort after adjustments made.  Additional velcro hook provided to pt due to difficulties maintaining on splint despite OT efforts.    Therapy Documentation Precautions:  Precautions Precautions: Fall Precaution Booklet Issued: No Precaution Comments: wound vac Required Braces or Orthoses: Splint/Cast Splint/Cast: Rt post op cast/splint Restrictions Weight Bearing Restrictions: Yes RUE Weight Bearing: Weight bearing as tolerated (through elbow only) RLE Weight Bearing: Non weight bearing LLE Weight Bearing: Weight bearing as tolerated (for transfers only)   Therapy/Group: Individual Therapy  Amie Critchley 04/15/2020, 5:31 PM

## 2020-04-15 NOTE — Progress Notes (Signed)
Occupational Therapy Session Note  Patient Details  Name: Martine Trageser MRN: [id number] Date of Birth: 1991/06/23  Today's Date: 04/15/2020 OT Individual Time: 1015-1120 OT Individual Time Calculation (min): 65 min    Short Term Goals: Week 1:  OT Short Term Goal 1 (Week 1): STGs=LTGs secodary to estimated short LOS  Skilled Therapeutic Interventions/Progress Updates:  Patient met lying supine in bed in agreement with OT treatment session with focus on functional transfers and self-care re-education as detailed below. Patient reporting fatigue as he was unable to sleep well last night. Patient able to transition from lying to sitting EOB with Min A and completed slide-board transfer to wc on L with supervison A. Toileting/hygiene/clothing management with Min A seated on wide, drop-arm BSC with patient able to manipulate clothing in sitting with lateral leans R<>L. Seated at sink level, patient performed 3/3 grooming tasks with independence. Wc mobility around hallway with focus on activity tolerance and endurance. OT provided education on use of gait belt as leg lifter for supine <> EOB transfers with patient return demonstrating with good accuracy. Session concluded with patient lying supine in bed with call bell within reach and all needs met.   Therapy Documentation Precautions:  Precautions Precautions: Fall Precaution Booklet Issued: No Precaution Comments: wound vac Required Braces or Orthoses: Splint/Cast Splint/Cast: Rt post op cast/splint Restrictions Weight Bearing Restrictions: Yes RUE Weight Bearing: Weight bear through elbow only RLE Weight Bearing: Non weight bearing LLE Weight Bearing: Weight bearing as tolerated General:    Therapy/Group: Individual Therapy  Clydell Alberts R [place] 04/15/2020, 9:22 AM
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
## 2020-04-15 NOTE — Plan of Care (Signed)
  Problem: Consults Goal: Skin Care Protocol Initiated - if Braden Score 18 or less Description: If consults are not indicated, leave blank or document N/A Outcome: Progressing   Problem: RH BOWEL ELIMINATION Goal: RH STG MANAGE BOWEL WITH ASSISTANCE Description: STG Manage Bowel with mod I Assistance. Outcome: Progressing Goal: RH STG MANAGE BOWEL W/MEDICATION W/ASSISTANCE Description: STG Manage Bowel with Medication with mod I Assistance. Outcome: Progressing   Problem: RH BLADDER ELIMINATION Goal: RH STG MANAGE BLADDER WITH ASSISTANCE Description: STG Manage Bladder With min Assistance Outcome: Progressing   Problem: RH SKIN INTEGRITY Goal: RH STG MAINTAIN SKIN INTEGRITY WITH ASSISTANCE Description: STG Maintain Skin Integrity With min Assistance. Outcome: Progressing Goal: RH STG ABLE TO PERFORM INCISION/WOUND CARE W/ASSISTANCE Description: STG Able To Perform Incision/Wound Care With min Assistance. Outcome: Progressing   Problem: RH SAFETY Goal: RH STG ADHERE TO SAFETY PRECAUTIONS W/ASSISTANCE/DEVICE Description: STG Adhere to Safety Precautions With cues and reminders Outcome: Progressing Goal: RH STG DECREASED RISK OF FALL WITH ASSISTANCE Description: STG Decreased Risk of Fall With cues and reminders Outcome: Progressing   Problem: RH PAIN MANAGEMENT Goal: RH STG PAIN MANAGED AT OR BELOW PT'S PAIN GOAL Description: Pain level less than 4 on scale of 0-10 Outcome: Progressing   
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
## 2020-04-16 ENCOUNTER — Telehealth: Payer: Self-pay

## 2020-04-16 ENCOUNTER — Other Ambulatory Visit: Payer: Self-pay | Admitting: Physical Medicine and Rehabilitation

## 2020-04-16 DIAGNOSIS — S52309A Unspecified fracture of shaft of unspecified radius, initial encounter for closed fracture: Secondary | ICD-10-CM

## 2020-04-16 DIAGNOSIS — S32810S Multiple fractures of pelvis with stable disruption of pelvic ring, sequela: Secondary | ICD-10-CM

## 2020-04-16 MED ORDER — VITAMIN D (ERGOCALCIFEROL) 1.25 MG (50000 UNIT) PO CAPS: 50000.0000 [IU] | ORAL_CAPSULE | ORAL | 0 refills | Status: DC

## 2020-04-16 MED ORDER — ASCORBIC ACID 1000 MG PO TABS: 1000.0000 mg | ORAL_TABLET | Freq: Every day | ORAL | 0 refills | Status: DC

## 2020-04-16 MED ORDER — TRAMADOL HCL 50 MG PO TABS: 50.0000 mg | ORAL_TABLET | Freq: Four times a day (QID) | ORAL | 0 refills | Status: DC | PRN

## 2020-04-16 MED ORDER — DOCUSATE SODIUM 100 MG PO CAPS: 100.0000 mg | ORAL_CAPSULE | Freq: Two times a day (BID) | ORAL | 0 refills | Status: DC

## 2020-04-16 MED ORDER — APIXABAN 2.5 MG PO TABS: 2.5000 mg | ORAL_TABLET | Freq: Two times a day (BID) | ORAL | 0 refills | Status: DC

## 2020-04-16 MED ORDER — PANTOPRAZOLE SODIUM 40 MG PO TBEC: 40.0000 mg | DELAYED_RELEASE_TABLET | Freq: Every day | ORAL | 0 refills | Status: DC

## 2020-04-16 MED ORDER — NORTRIPTYLINE HCL 10 MG PO CAPS: 10.0000 mg | ORAL_CAPSULE | Freq: Every day | ORAL | 0 refills | Status: DC

## 2020-04-16 MED ORDER — PREGABALIN 200 MG PO CAPS: 200.0000 mg | ORAL_CAPSULE | Freq: Three times a day (TID) | ORAL | 0 refills | Status: DC

## 2020-04-16 MED ORDER — VITAMIN D3 25 MCG PO TABS: 2000.0000 [IU] | ORAL_TABLET | Freq: Two times a day (BID) | ORAL | 0 refills | Status: DC

## 2020-04-16 MED ORDER — METHOCARBAMOL 500 MG PO TABS: 500.0000 mg | ORAL_TABLET | Freq: Three times a day (TID) | ORAL | 0 refills | Status: DC

## 2020-04-16 MED ORDER — POLYETHYLENE GLYCOL 3350 17 G PO PACK: 17.0000 g | PACK | Freq: Every day | ORAL | 0 refills | Status: DC

## 2020-04-16 MED ORDER — ACETAMINOPHEN 325 MG PO TABS: 325.0000 mg | ORAL_TABLET | ORAL | Status: DC | PRN
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
## 2020-04-16 NOTE — Plan of Care (Signed)
Problem: Consults Goal: Skin Care Protocol Initiated - if Braden Score 18 or less Description: If consults are not indicated, leave blank or document N/A Outcome: Completed/Met   Problem: RH BOWEL ELIMINATION Goal: RH STG MANAGE BOWEL WITH ASSISTANCE Description: STG Manage Bowel with mod I Assistance. Outcome: Completed/Met Goal: RH STG MANAGE BOWEL W/MEDICATION W/ASSISTANCE Description: STG Manage Bowel with Medication with mod I Assistance. Outcome: Completed/Met   Problem: RH BLADDER ELIMINATION Goal: RH STG MANAGE BLADDER WITH ASSISTANCE Description: STG Manage Bladder With min Assistance Outcome: Completed/Met   Problem: RH SKIN INTEGRITY Goal: RH STG MAINTAIN SKIN INTEGRITY WITH ASSISTANCE Description: STG Maintain Skin Integrity With min Assistance. Outcome: Completed/Met Goal: RH STG ABLE TO PERFORM INCISION/WOUND CARE W/ASSISTANCE Description: STG Able To Perform Incision/Wound Care With min Assistance. Outcome: Completed/Met   Problem: RH SAFETY Goal: RH STG ADHERE TO SAFETY PRECAUTIONS W/ASSISTANCE/DEVICE Description: STG Adhere to Safety Precautions With cues and reminders Outcome: Completed/Met Goal: RH STG DECREASED RISK OF FALL WITH ASSISTANCE Description: STG Decreased Risk of Fall With cues and reminders Outcome: Completed/Met   Problem: RH PAIN MANAGEMENT Goal: RH STG PAIN MANAGED AT OR BELOW PT'S PAIN GOAL Description: Pain level less than 4 on scale of 0-10 Outcome: Completed/Met
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
## 2020-04-16 NOTE — Telephone Encounter (Signed)
Please call [name] at [facility] to clarify the vitamin D dosage.  CB# [phone].   Thank you

## 2020-04-16 NOTE — Discharge Instructions (Signed)
Inpatient Rehab Discharge Instructions  Nicholas Randall Discharge date and time: 04/16/20   Activities/Precautions/ Functional Status: Activity: NO weight on Right leg. May bear weight on left leg for transfers only. NO WALKING. Can put weight on right elbow only. Do not put any weight on  right wrist. Diet: regular diet Wound Care: Wash with soap and water. Keep wound clean and dry Contact Dr.Handy if you develop any problems with your incision/wound--redness, swelling, increase in pain, drainage or if you develop fever or chills.  --no ointments, creams or lotions on the wound.   Functional status:  ___ No restrictions     ___ Walk up steps independently _X__ 24/7 supervision/assistance   ___ Walk up steps with assistance ___ Intermittent supervision/assistance  ___ Bathe/dress independently ___ Walk with walker     _X__ Bathe/dress with assistance ___ Walk Independently    ___ Shower independently ___ Walk with assistance    ___ Shower with assistance _X__ No alcohol     ___ Return to work/school ________  COMMUNITY REFERRALS UPON DISCHARGE:  Home Health: Deferred until weight bearing status is updated  Medical Equipment/Items Ordered:Hospital bed, Heavy Duty DABSC, slideboard, Heavy Duty wheelchair   Agency/Supplier:[facility]  Special Instructions: 1. No driving,  smoking or alcohol   My questions have been answered and I understand these instructions. I will adhere to these goals and the provided educational materials after my discharge from the hospital.  Patient/Caregiver Signature _______________________________ Date __________  Clinician Signature _______________________________________ Date __________  Please bring this form and your medication list with you to all your follow-up doctor's appointments.   ================================================================================================  Information on my medicine - ELIQUIS  (apixaban)  Why was Eliquis prescribed for you? Eliquis was prescribed for you to reduce the risk of blood clots forming after orthopedic surgery.    What do You need to know about Eliquis? Take your Eliquis TWICE DAILY - one tablet in the morning and one tablet in the evening with or without food.  It would be best to take the dose about the same time each day.  If you have difficulty swallowing the tablet whole please discuss with your pharmacist how to take the medication safely.  Take Eliquis exactly as prescribed by your doctor and DO NOT stop taking Eliquis without talking to the doctor who prescribed the medication.  Stopping without other medication to take the place of Eliquis may increase your risk of developing a clot.  After discharge, you should have regular check-up appointments with your healthcare provider that is prescribing your Eliquis.  What do you do if you miss a dose? If a dose of ELIQUIS is not taken at the scheduled time, take it as soon as possible on the same day and twice-daily administration should be resumed.  The dose should not be doubled to make up for a missed dose.  Do not take more than one tablet of ELIQUIS at the same time.  Important Safety Information A possible side effect of Eliquis is bleeding. You should call your healthcare provider right away if you experience any of the following: ? Bleeding from an injury or your nose that does not stop. ? Unusual colored urine (red or dark brown) or unusual colored stools (red or black). ? Unusual bruising for unknown reasons. ? A serious fall or if you hit your head (even if there is no bleeding).  Some medicines may interact with Eliquis and might increase your risk of bleeding or clotting while on Eliquis. To  help avoid this, consult your healthcare provider or pharmacist prior to using any new prescription or non-prescription medications, including herbals, vitamins, non-steroidal  anti-inflammatory drugs (NSAIDs) and supplements.  This website has more information on Eliquis (apixaban): http://www.eliquis.com/eliquis/home
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
## 2020-04-16 NOTE — Progress Notes (Signed)
Inpatient Rehabilitation Care Coordinator  Discharge Note  The overall goal for the admission was met for:   Discharge location: Yes; Home with Fiance'  Length of Stay: 10 Days with discharge 04/16/20  Discharge activity level: Pt directed in WC mobility 200' CGA for small area turns and more narrow spaces. Patient able to transition from lying to sitting EOB with Min A and completed slide-board transfer to wc on L with supervison A. Toileting/hygiene/clothing management with Min A seated on wide, drop-arm BSC with patient able to manipulate clothing in sitting with lateral leans R<>L. Seated at sink level, patient performed 3/3 grooming tasks with independence.  Home/community participation: Acupuncturist provided included: MD, RD, PT, OT, RN, CM, TR, Pharmacy and SW  Financial Services: Private Insurance: [employer]  Follow-up services arranged: Other: [place] deferred until weight bearing status is updated  DME: Hospital bed, Heavy Duty DABSC, slideboard and Heavy Duty Wheelchair  Comments (or additional information): [place] [phone]  Patient/Family verbalized understanding of follow-up arrangements: Yes  Family education completed with mother and fiance' 04/15/20.  Individual responsible for coordination of the follow-up plan: Self: Nicholas Randall [phone]  Confirmed correct DME delivered: Nicholas Randall 04/16/2020    Nicholas Randall

## 2020-04-23 ENCOUNTER — Telehealth: Payer: Self-pay | Admitting: *Deleted

## 2020-04-23 NOTE — Telephone Encounter (Signed)
[name] called and reports that the medication is not working and Nicholas Randall is still in a lot of pain especially in left foot. He has not been seen in the office, has an appt for 05/09/20. Please advise.

## 2020-04-24 ENCOUNTER — Telehealth: Payer: Self-pay

## 2020-04-24 NOTE — Telephone Encounter (Signed)
Mrs Goga has called back and is asking for a call from Dr Wynn Banker about her son's pain in his left foot. The number she left is [phone].

## 2020-04-29 NOTE — Telephone Encounter (Signed)
error 

## 2020-05-05 ENCOUNTER — Other Ambulatory Visit (HOSPITAL_COMMUNITY): Payer: Self-pay | Admitting: Orthopedic Surgery

## 2020-05-05 ENCOUNTER — Other Ambulatory Visit: Payer: Self-pay | Admitting: Orthopedic Surgery

## 2020-05-05 DIAGNOSIS — S63014A Dislocation of distal radioulnar joint of right wrist, initial encounter: Secondary | ICD-10-CM

## 2020-05-06 ENCOUNTER — Other Ambulatory Visit: Payer: Self-pay | Admitting: Physical Medicine and Rehabilitation

## 2020-05-08 ENCOUNTER — Other Ambulatory Visit: Payer: Self-pay | Admitting: Physical Medicine and Rehabilitation

## 2020-05-09 ENCOUNTER — Encounter
Payer: PRIVATE HEALTH INSURANCE | Attending: Physical Medicine & Rehabilitation | Admitting: Physical Medicine & Rehabilitation

## 2020-05-11 ENCOUNTER — Other Ambulatory Visit: Payer: Self-pay | Admitting: Physical Medicine and Rehabilitation

## 2020-05-16 ENCOUNTER — Other Ambulatory Visit: Payer: Self-pay | Admitting: Physical Medicine and Rehabilitation

## 2020-05-20 ENCOUNTER — Ambulatory Visit (HOSPITAL_COMMUNITY)
Admission: RE | Admit: 2020-05-20 | Discharge: 2020-05-20 | Disposition: A | Discharge status: Home / Self Care | Payer: PRIVATE HEALTH INSURANCE | Source: Ambulatory Visit | Attending: Orthopedic Surgery | Admitting: Orthopedic Surgery

## 2020-05-20 ENCOUNTER — Other Ambulatory Visit: Payer: Self-pay

## 2020-05-20 DIAGNOSIS — S63014A Dislocation of distal radioulnar joint of right wrist, initial encounter: Secondary | ICD-10-CM | POA: Insufficient documentation

## 2020-06-23 ENCOUNTER — Other Ambulatory Visit (HOSPITAL_COMMUNITY)
Admission: RE | Admit: 2020-06-23 | Discharge: 2020-06-23 | Disposition: A | Discharge status: Home / Self Care | Payer: PRIVATE HEALTH INSURANCE | Source: Ambulatory Visit | Attending: Orthopedic Surgery | Admitting: Orthopedic Surgery

## 2020-06-23 DIAGNOSIS — Z20822 Contact with and (suspected) exposure to covid-19: Secondary | ICD-10-CM | POA: Diagnosis not present

## 2020-06-23 DIAGNOSIS — Z01812 Encounter for preprocedural laboratory examination: Secondary | ICD-10-CM | POA: Insufficient documentation

## 2020-06-23 LAB — SARS CORONAVIRUS 2 (TAT 6-24 HRS): SARS Coronavirus 2: NEGATIVE

## 2020-06-24 ENCOUNTER — Ambulatory Visit (HOSPITAL_COMMUNITY)
Admission: RE | Admit: 2020-06-24 | Discharge: 2020-06-24 | Disposition: A | Discharge status: Home / Self Care | Payer: PRIVATE HEALTH INSURANCE | Attending: Orthopedic Surgery | Admitting: Orthopedic Surgery

## 2020-06-24 ENCOUNTER — Other Ambulatory Visit: Payer: Self-pay

## 2020-06-24 ENCOUNTER — Encounter (HOSPITAL_COMMUNITY): Payer: Self-pay | Admitting: Orthopedic Surgery

## 2020-06-24 ENCOUNTER — Ambulatory Visit (HOSPITAL_COMMUNITY): Payer: PRIVATE HEALTH INSURANCE

## 2020-06-24 ENCOUNTER — Encounter (HOSPITAL_COMMUNITY)
Admission: RE | Disposition: A | Discharge status: Home / Self Care | Payer: Self-pay | Source: Home / Self Care | Attending: Orthopedic Surgery

## 2020-06-24 ENCOUNTER — Ambulatory Visit (HOSPITAL_COMMUNITY): Payer: PRIVATE HEALTH INSURANCE | Admitting: Certified Registered Nurse Anesthetist

## 2020-06-24 DIAGNOSIS — Z7901 Long term (current) use of anticoagulants: Secondary | ICD-10-CM | POA: Insufficient documentation

## 2020-06-24 DIAGNOSIS — M25331 Other instability, right wrist: Secondary | ICD-10-CM | POA: Diagnosis not present

## 2020-06-24 DIAGNOSIS — S63591A Other specified sprain of right wrist, initial encounter: Secondary | ICD-10-CM | POA: Diagnosis not present

## 2020-06-24 DIAGNOSIS — F17210 Nicotine dependence, cigarettes, uncomplicated: Secondary | ICD-10-CM | POA: Diagnosis not present

## 2020-06-24 DIAGNOSIS — S52371A Galeazzi's fracture of right radius, initial encounter for closed fracture: Secondary | ICD-10-CM | POA: Insufficient documentation

## 2020-06-24 DIAGNOSIS — X58XXXA Exposure to other specified factors, initial encounter: Secondary | ICD-10-CM | POA: Insufficient documentation

## 2020-06-24 DIAGNOSIS — Z79899 Other long term (current) drug therapy: Secondary | ICD-10-CM | POA: Diagnosis not present

## 2020-06-24 HISTORY — PX: WRIST ARTHROSCOPY WITH FOVEAL TRIANGULAR FIBROCARTILAGE COMPLEX REPAIR: SHX6403

## 2020-06-24 SURGERY — WRIST ARTHROSCOPY WITH FOVEAL TRIANGULAR FIBROCARTILAGE COMPLEX REPAIR
Anesthesia: Regional | Site: Arm Lower | Laterality: Right

## 2020-06-24 MED ORDER — ONDANSETRON HCL 4 MG/2ML IJ SOLN
INTRAMUSCULAR | Status: DC | PRN
  Administered 2020-06-24: 4 mg via INTRAVENOUS

## 2020-06-24 MED ORDER — SUGAMMADEX SODIUM 200 MG/2ML IV SOLN
INTRAVENOUS | Status: DC | PRN
  Administered 2020-06-24 (×2): 200 mg via INTRAVENOUS

## 2020-06-24 MED ORDER — FENTANYL CITRATE (PF) 250 MCG/5ML IJ SOLN
INTRAMUSCULAR | Status: DC | PRN
Start: 2020-06-24 — End: 2020-06-24
  Administered 2020-06-24: 100 ug via INTRAVENOUS
  Administered 2020-06-24 (×2): 50 ug via INTRAVENOUS

## 2020-06-24 MED ORDER — ESMOLOL HCL 100 MG/10ML IV SOLN
INTRAVENOUS | Status: AC
  Filled 2020-06-24: qty 10

## 2020-06-24 MED ORDER — SUCCINYLCHOLINE CHLORIDE 200 MG/10ML IV SOSY
PREFILLED_SYRINGE | INTRAVENOUS | Status: DC | PRN
  Administered 2020-06-24: 180 mg via INTRAVENOUS

## 2020-06-24 MED ORDER — MIDAZOLAM HCL 2 MG/2ML IJ SOLN
INTRAMUSCULAR | Status: DC | PRN
  Administered 2020-06-24: 2 mg via INTRAVENOUS

## 2020-06-24 MED ORDER — SUCCINYLCHOLINE CHLORIDE 200 MG/10ML IV SOSY
PREFILLED_SYRINGE | INTRAVENOUS | Status: AC
  Filled 2020-06-24: qty 10

## 2020-06-24 MED ORDER — LIDOCAINE 2% (20 MG/ML) 5 ML SYRINGE
INTRAMUSCULAR | Status: DC | PRN
  Administered 2020-06-24: 100 mg via INTRAVENOUS

## 2020-06-24 MED ORDER — MIDAZOLAM HCL 2 MG/2ML IJ SOLN: 2.0000 mg | Freq: Once | INTRAMUSCULAR | Status: AC

## 2020-06-24 MED ORDER — METHOCARBAMOL 500 MG PO TABS: 500.0000 mg | ORAL_TABLET | Freq: Four times a day (QID) | ORAL | 0 refills | Status: DC | PRN

## 2020-06-24 MED ORDER — CHLORHEXIDINE GLUCONATE 0.12 % MT SOLN
15.0000 mL | Freq: Once | OROMUCOSAL | Status: AC
  Administered 2020-06-24: 15 mL via OROMUCOSAL
  Filled 2020-06-24: qty 15

## 2020-06-24 MED ORDER — ROCURONIUM BROMIDE 10 MG/ML (PF) SYRINGE
PREFILLED_SYRINGE | INTRAVENOUS | Status: DC | PRN
  Administered 2020-06-24: 50 mg via INTRAVENOUS
  Administered 2020-06-24 (×2): 20 mg via INTRAVENOUS

## 2020-06-24 MED ORDER — LIDOCAINE 2% (20 MG/ML) 5 ML SYRINGE
INTRAMUSCULAR | Status: AC
  Filled 2020-06-24: qty 5

## 2020-06-24 MED ORDER — PROPOFOL 10 MG/ML IV BOLUS
INTRAVENOUS | Status: AC
  Filled 2020-06-24: qty 20

## 2020-06-24 MED ORDER — ESMOLOL HCL 100 MG/10ML IV SOLN
INTRAVENOUS | Status: DC | PRN
  Administered 2020-06-24: 40 mg via INTRAVENOUS
  Administered 2020-06-24: 20 mg via INTRAVENOUS

## 2020-06-24 MED ORDER — PROPOFOL 10 MG/ML IV BOLUS
INTRAVENOUS | Status: DC | PRN
  Administered 2020-06-24: 200 mg via INTRAVENOUS

## 2020-06-24 MED ORDER — FENTANYL CITRATE (PF) 100 MCG/2ML IJ SOLN: 25.0000 ug | INTRAMUSCULAR | Status: DC | PRN

## 2020-06-24 MED ORDER — ONDANSETRON HCL 4 MG PO TABS: 4.0000 mg | ORAL_TABLET | Freq: Every day | ORAL | 1 refills | Status: AC | PRN

## 2020-06-24 MED ORDER — DEXAMETHASONE SODIUM PHOSPHATE 10 MG/ML IJ SOLN
INTRAMUSCULAR | Status: DC | PRN
  Administered 2020-06-24: 10 mg via INTRAVENOUS

## 2020-06-24 MED ORDER — OXYCODONE HCL 5 MG PO TABS: 5.0000 mg | ORAL_TABLET | ORAL | 0 refills | Status: AC | PRN

## 2020-06-24 MED ORDER — ROCURONIUM BROMIDE 10 MG/ML (PF) SYRINGE
PREFILLED_SYRINGE | INTRAVENOUS | Status: AC
  Filled 2020-06-24: qty 10

## 2020-06-24 MED ORDER — FENTANYL CITRATE (PF) 100 MCG/2ML IJ SOLN
INTRAMUSCULAR | Status: AC
Start: 2020-06-24 — End: 2020-06-24
  Administered 2020-06-24: 50 ug via INTRAVENOUS
  Filled 2020-06-24: qty 2

## 2020-06-24 MED ORDER — LACTATED RINGERS IV SOLN: INTRAVENOUS | Status: DC

## 2020-06-24 MED ORDER — ONDANSETRON HCL 4 MG/2ML IJ SOLN
INTRAMUSCULAR | Status: AC
  Filled 2020-06-24: qty 2

## 2020-06-24 MED ORDER — PROMETHAZINE HCL 25 MG/ML IJ SOLN: 6.2500 mg | INTRAMUSCULAR | Status: DC | PRN

## 2020-06-24 MED ORDER — 0.9 % SODIUM CHLORIDE (POUR BTL) OPTIME
TOPICAL | Status: DC | PRN
  Administered 2020-06-24: 1000 mL

## 2020-06-24 MED ORDER — CEPHALEXIN 500 MG PO CAPS: 500.0000 mg | ORAL_CAPSULE | Freq: Four times a day (QID) | ORAL | 0 refills | Status: AC

## 2020-06-24 MED ORDER — ROPIVACAINE HCL 5 MG/ML IJ SOLN
INTRAMUSCULAR | Status: DC | PRN
  Administered 2020-06-24: 30 mL via EPIDURAL

## 2020-06-24 MED ORDER — DEXAMETHASONE SODIUM PHOSPHATE 10 MG/ML IJ SOLN
INTRAMUSCULAR | Status: AC
  Filled 2020-06-24: qty 1

## 2020-06-24 MED ORDER — MIDAZOLAM HCL 2 MG/2ML IJ SOLN
INTRAMUSCULAR | Status: AC
  Filled 2020-06-24: qty 2

## 2020-06-24 MED ORDER — BUPIVACAINE HCL (PF) 0.25 % IJ SOLN
INTRAMUSCULAR | Status: AC
  Filled 2020-06-24: qty 30

## 2020-06-24 MED ORDER — FENTANYL CITRATE (PF) 250 MCG/5ML IJ SOLN
INTRAMUSCULAR | Status: AC
  Filled 2020-06-24: qty 5

## 2020-06-24 MED ORDER — MIDAZOLAM HCL 2 MG/2ML IJ SOLN
INTRAMUSCULAR | Status: AC
  Administered 2020-06-24: 2 mg via INTRAVENOUS
  Filled 2020-06-24: qty 2

## 2020-06-24 MED ORDER — DEXTROSE 5 % IV SOLN
3.0000 g | INTRAVENOUS | Status: AC
  Administered 2020-06-24: 3 g via INTRAVENOUS
  Filled 2020-06-24: qty 3

## 2020-06-24 MED ORDER — ORAL CARE MOUTH RINSE: 15.0000 mL | Freq: Once | OROMUCOSAL | Status: AC

## 2020-06-24 MED ORDER — FENTANYL CITRATE (PF) 100 MCG/2ML IJ SOLN: 50.0000 ug | Freq: Once | INTRAMUSCULAR | Status: AC

## 2020-06-24 SURGICAL SUPPLY — 60 items
BLADE CLIPPER SURG (BLADE) IMPLANT
BNDG ELASTIC 3X5.8 VLCR STR LF (GAUZE/BANDAGES/DRESSINGS) ×2 IMPLANT
BNDG ELASTIC 4X5.8 VLCR STR LF (GAUZE/BANDAGES/DRESSINGS) ×2 IMPLANT
BNDG ESMARK 4X9 LF (GAUZE/BANDAGES/DRESSINGS) ×2 IMPLANT
BNDG GAUZE ELAST 4 BULKY (GAUZE/BANDAGES/DRESSINGS) ×2 IMPLANT
CANISTER SUCT 3000ML PPV (MISCELLANEOUS) ×2 IMPLANT
CORD BIPOLAR FORCEPS 12FT (ELECTRODE) ×2 IMPLANT
COVER SURGICAL LIGHT HANDLE (MISCELLANEOUS) ×2 IMPLANT
COVER WAND RF STERILE (DRAPES) IMPLANT
CUFF TOURN SGL QUICK 18X4 (TOURNIQUET CUFF) ×2 IMPLANT
CUFF TOURN SGL QUICK 24 (TOURNIQUET CUFF)
CUFF TRNQT CYL 24X4X16.5-23 (TOURNIQUET CUFF) IMPLANT
DRAIN TLS ROUND 10FR (DRAIN) IMPLANT
DRAPE OEC MINIVIEW 54X84 (DRAPES) ×2 IMPLANT
DRAPE SURG 17X23 STRL (DRAPES) ×2 IMPLANT
GAUZE SPONGE 4X4 12PLY STRL (GAUZE/BANDAGES/DRESSINGS) ×2 IMPLANT
GAUZE XEROFORM 1X8 LF (GAUZE/BANDAGES/DRESSINGS) ×2 IMPLANT
GLOVE SS BIOGEL STRL SZ 8 (GLOVE) ×1 IMPLANT
GLOVE SUPERSENSE BIOGEL SZ 8 (GLOVE) ×1
GOWN STRL REUS W/ TWL LRG LVL3 (GOWN DISPOSABLE) ×1 IMPLANT
GOWN STRL REUS W/ TWL XL LVL3 (GOWN DISPOSABLE) ×1 IMPLANT
GOWN STRL REUS W/TWL LRG LVL3 (GOWN DISPOSABLE) ×1
GOWN STRL REUS W/TWL XL LVL3 (GOWN DISPOSABLE) ×1
K-WIRE SURGICAL 1.6X102 (WIRE) ×6 IMPLANT
KIT BASIN OR (CUSTOM PROCEDURE TRAY) ×2 IMPLANT
KIT TURNOVER KIT B (KITS) ×2 IMPLANT
LOOP VESSEL MAXI BLUE (MISCELLANEOUS) IMPLANT
MANIFOLD NEPTUNE II (INSTRUMENTS) ×2 IMPLANT
NEEDLE 22X1 1/2 (OR ONLY) (NEEDLE) IMPLANT
NS IRRIG 1000ML POUR BTL (IV SOLUTION) ×2 IMPLANT
PACK ORTHO EXTREMITY (CUSTOM PROCEDURE TRAY) ×2 IMPLANT
PAD ARMBOARD 7.5X6 YLW CONV (MISCELLANEOUS) ×4 IMPLANT
PAD CAST 3X4 CTTN HI CHSV (CAST SUPPLIES) ×1 IMPLANT
PAD CAST 4YDX4 CTTN HI CHSV (CAST SUPPLIES) ×1 IMPLANT
PADDING CAST COTTON 3X4 STRL (CAST SUPPLIES) ×1
PADDING CAST COTTON 4X4 STRL (CAST SUPPLIES) ×1
PASSER SUT SWANSON 36MM LOOP (INSTRUMENTS) ×4 IMPLANT
RETRIEVER SUT HEWSON (MISCELLANEOUS) ×2 IMPLANT
SLING ARM FOAM STRAP XLG (SOFTGOODS) ×2 IMPLANT
SOL PREP POV-IOD 4OZ 10% (MISCELLANEOUS) ×4 IMPLANT
SPONGE LAP 4X18 RFD (DISPOSABLE) IMPLANT
SUT FIBERWIRE #2 38 T-5 BLUE (SUTURE) ×2
SUT FIBERWIRE 2-0 18 17.9 3/8 (SUTURE) ×4
SUT FIBERWIRE 3-0 18 DIAM 3/8 (SUTURE) ×4
SUT MNCRL AB 4-0 PS2 18 (SUTURE) IMPLANT
SUT PROLENE 3 0 PS 2 (SUTURE) ×6 IMPLANT
SUT VIC AB 3-0 FS2 27 (SUTURE) IMPLANT
SUT VIC AB 3-0 PS2 18 (SUTURE) ×2 IMPLANT
SUTURE FIBERWR #2 38 T-5 BLUE (SUTURE) ×1 IMPLANT
SUTURE FIBERWR 2-0 18 17.9 3/8 (SUTURE) ×2 IMPLANT
SUTURE FIBERWR 3-0 18 DIAM 3/8 (SUTURE) ×2 IMPLANT
SYR CONTROL 10ML LL (SYRINGE) IMPLANT
SYSTEM CHEST DRAIN TLS 7FR (DRAIN) IMPLANT
TOWEL GREEN STERILE (TOWEL DISPOSABLE) ×2 IMPLANT
TOWEL GREEN STERILE FF (TOWEL DISPOSABLE) ×2 IMPLANT
TRAP DIGIT (INSTRUMENTS) ×2 IMPLANT
TUBE CONNECTING 12X1/4 (SUCTIONS) ×2 IMPLANT
TUBE EVACUATION TLS (MISCELLANEOUS) ×2 IMPLANT
UNDERPAD 30X36 HEAVY ABSORB (UNDERPADS AND DIAPERS) ×2 IMPLANT
WATER STERILE IRR 1000ML POUR (IV SOLUTION) ×2 IMPLANT
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
## 2020-06-24 NOTE — Anesthesia Postprocedure Evaluation (Signed)
Anesthesia Post Note  Patient: Nicholas Randall  Procedure(s) Performed: OPEN RECONSTRUCTION RIGHT TRIANGULAR FIBROCARTILAGE COMPLEX WITH DISTAL RADIAL ULNAR JOINT PINNING AND REPAIR RECONSTRUCTION AS NECESSARY (Right Arm Lower)     Patient location during evaluation: PACU Anesthesia Type: Regional and General Level of consciousness: sedated Pain management: pain level controlled Vital Signs Assessment: post-procedure vital signs reviewed and stable Respiratory status: spontaneous breathing and respiratory function stable Cardiovascular status: stable Postop Assessment: no apparent nausea or vomiting Anesthetic complications: no   No complications documented.  Last Vitals:  Vitals:   06/24/20 1330 06/24/20 1345  BP: 132/74 112/71  Pulse: 84 88  Resp: 17 12  Temp:  36.8 C  SpO2: 98% 95%    Last Pain:  Vitals:   06/24/20 1345  PainSc: 0-No pain                 Candra R Luara Faye
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
## 2020-06-24 NOTE — Anesthesia Procedure Notes (Addendum)
Procedure Name: Intubation Date/Time: 06/24/2020 11:15 AM Performed by: Shary Decamp, CRNA Pre-anesthesia Checklist: Patient identified, Patient being monitored, Timeout performed, Emergency Drugs available and Suction available Patient Re-evaluated:Patient Re-evaluated prior to induction Oxygen Delivery Method: Circle system utilized Preoxygenation: Pre-oxygenation with 100% oxygen Induction Type: IV induction, Rapid sequence and Cricoid Pressure applied Ventilation: Mask ventilation without difficulty and Oral airway inserted - appropriate to patient size Laryngoscope Size: 3 and Miller Grade View: Grade III Tube type: Oral Tube size: 8.0 mm Number of attempts: 1 Airway Equipment and Method: Stylet Placement Confirmation: ETT inserted through vocal cords under direct vision,  positive ETCO2 and breath sounds checked- equal and bilateral Secured at: 22 cm Tube secured with: Tape Dental Injury: Teeth and Oropharynx as per pre-operative assessment

## 2020-06-24 NOTE — Transfer of Care (Signed)
Immediate Anesthesia Transfer of Care Note  Patient: Nicholas Randall  Procedure(s) Performed: OPEN RECONSTRUCTION RIGHT TRIANGULAR FIBROCARTILAGE COMPLEX WITH DISTAL RADIAL ULNAR JOINT PINNING AND REPAIR RECONSTRUCTION AS NECESSARY (Right Arm Lower)  Patient Location: PACU  Anesthesia Type:General  Level of Consciousness: awake, alert , patient cooperative and responds to stimulation  Airway & Oxygen Therapy: Patient Spontanous Breathing and Patient connected to face mask oxygen  Post-op Assessment: Report given to RN and Post -op Vital signs reviewed and stable  Post vital signs: Reviewed and stable  Last Vitals:  Vitals Value Taken Time  BP 124/101 06/24/20 1313  Temp    Pulse 91 06/24/20 1315  Resp 22 06/24/20 1315  SpO2 96 % 06/24/20 1315  Vitals shown include unvalidated device data.  Last Pain: There were no vitals filed for this visit.       Complications: No complications documented.

## 2020-06-24 NOTE — Anesthesia Procedure Notes (Signed)
Anesthesia Regional Block: Supraclavicular block   Pre-Anesthetic Checklist: ,, timeout performed, Correct Patient, Correct Site, Correct Laterality, Correct Procedure, Correct Position, site marked, Risks and benefits discussed,  Surgical consent,  Pre-op evaluation,  At surgeon's request and post-op pain management  Laterality: Right  Prep: chloraprep       Needles:  Injection technique: Single-shot  Needle Type: Echogenic Stimulator Needle     Needle Length: 9cm  Needle Gauge: 22     Additional Needles:   Procedures:,,,, ultrasound used (permanent image in chart),,,,  Narrative:  Start time: 06/24/2020 9:25 AM End time: 06/24/2020 9:30 AM Injection made incrementally with aspirations every 5 mL.  Performed by: Personally  Anesthesiologist: Mellody Dance, MD  Additional Notes: Functioning IV was confirmed and monitors applied.  . Sterile prep and drape,hand hygiene and sterile gloves were used.Ultrasound guidance: relevant anatomy identified, needle position confirmed, local anesthetic spread visualized around nerve(s)., vascular puncture avoided.  Image printed for medical record.  Negative aspiration and negative test dose prior to incremental administration of local anesthetic. The patient tolerated the procedure well.
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
## 2020-06-24 NOTE — Discharge Instructions (Signed)
Please elevate and move your fingers frequently.  It is very important to massage move and elevate the fingers to prevent excessive swelling.  We will call for your follow-up appointment to be seen in 14 days.  Please do not place weight on your elbow wrist or forearm.  Please call for any problems.  We encourage you to take your pain medicine as necessary.  The first 8 to 12 hours after the block wears off will be the worst time for you.  Please take the antibiotics until they are completed.  You may take 2 pain pills at a time.  This is 2 tablets every 4 hours as needed for pain.  Please make sure to take a stool softener as the pain medicine will constipate you.  We recommend that you to take vitamin C 1000 mg a day to promote healing. We also recommend that if you require  pain medicine that you take a stool softener to prevent constipation as most pain medicines will have constipation side effects. We recommend either Peri-Colace or Senokot and recommend that you also consider adding MiraLAX as well to prevent the constipation affects from pain medicine if you are required to use them. These medicines are over the counter and may be purchased at a local pharmacy. A cup of yogurt and a probiotic can also be helpful during the recovery process as the medicines can disrupt your intestinal environment. Keep bandage clean and dry.  Call for any problems.  No smoking.  Criteria for driving a car: you should be off your pain medicine for 7-8 hours, able to drive one handed(confident), thinking clearly and feeling able in your judgement to drive. Continue elevation as it will decrease swelling.  If instructed by MD move your fingers within the confines of the bandage/splint.  Use ice if instructed by your MD. Call immediately for any sudden loss of feeling in your hand/arm or change in functional abilities of the extremity.

## 2020-06-25 ENCOUNTER — Encounter (HOSPITAL_COMMUNITY): Payer: Self-pay | Admitting: Orthopedic Surgery

## 2020-07-07 ENCOUNTER — Other Ambulatory Visit: Payer: Self-pay | Admitting: Physical Medicine and Rehabilitation

## 2021-10-08 IMAGING — DX DG CERVICAL SPINE FLEX&EXT ONLY
2 series · 2 of 2 positions shown · non-contrast
Comparison: CT 04/01/2020

CLINICAL DATA: Flexion and extension views, postop

EXAM:
CERVICAL SPINE - FLEXION AND EXTENSION VIEWS ONLY

[c-spine flex]
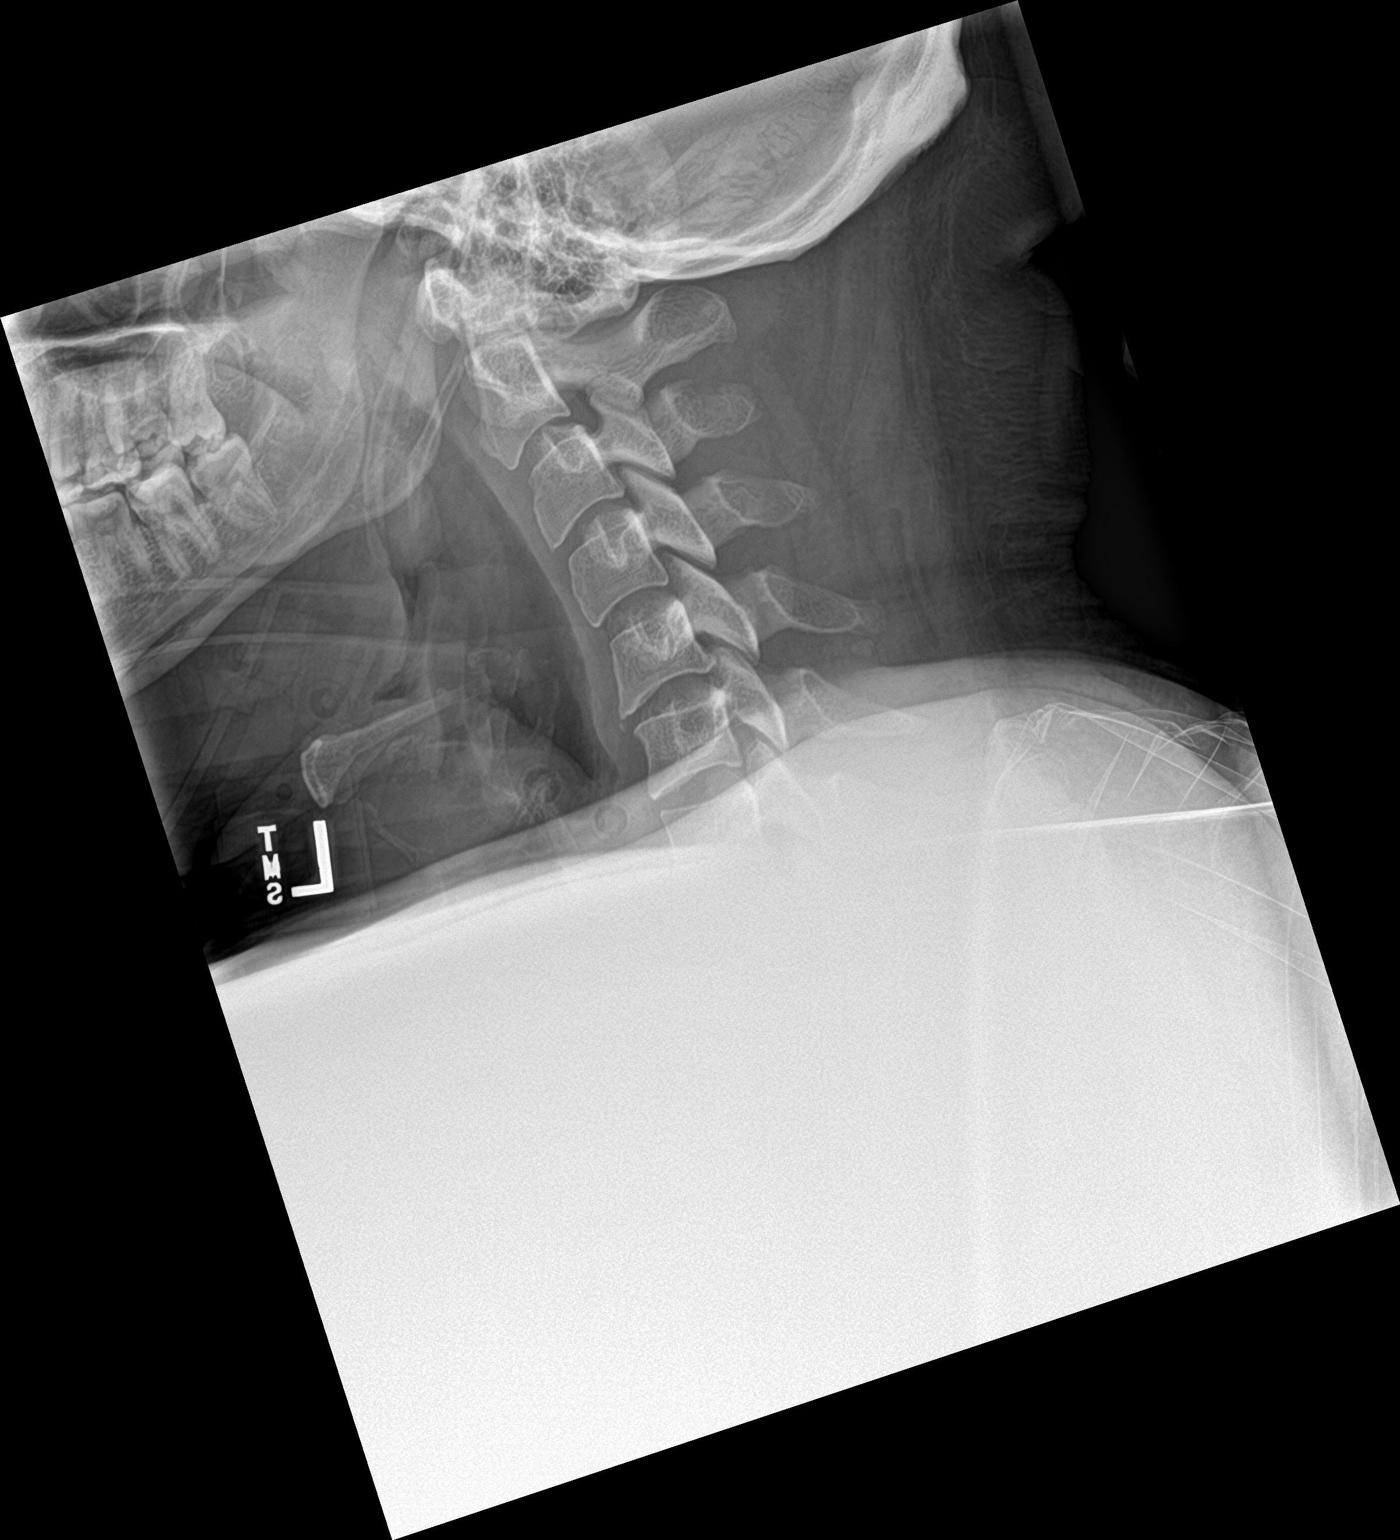

[c-spine ext]
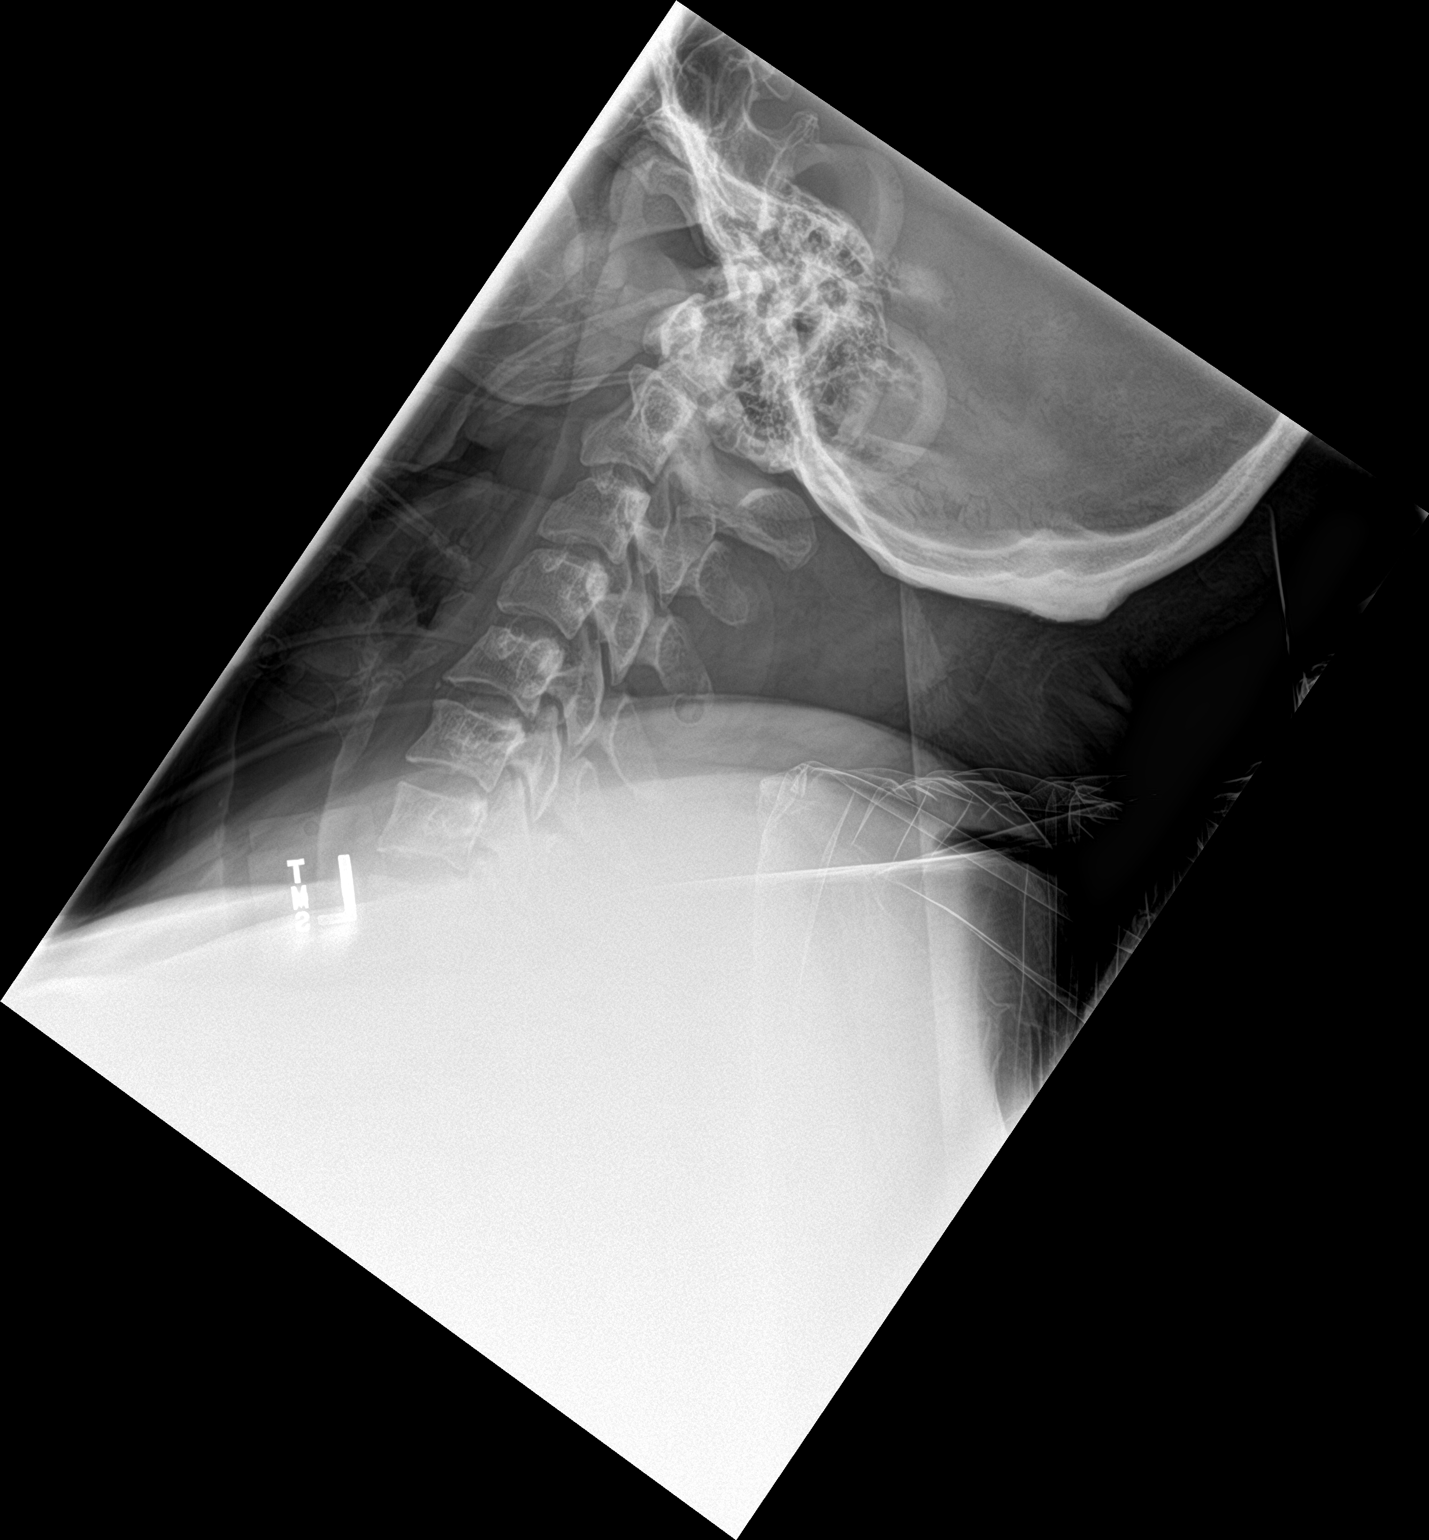

[2 of 2 positions shown; findings below may reference images not displayed]

FINDINGS: No acute fracture or traumatic listhesis is evident on this lateral
only radiographs. Redemonstration of the mild spondylitic changes at
C5-6. No dynamic instability with flexion or extension views.
Atlantodental interval is maintained in both positions as well. No
prevertebral or paravertebral soft tissue abnormality.
IMPRESSION: No acute fracture or traumatic listhesis. No dynamic instability
with flexion or extension views.

Mild spondylitic changes at C5-6.

## 2021-10-08 IMAGING — DX DG KNEE 1-2V PORT*L*
3 series · 3 of 3 positions shown · non-contrast
Comparison: None.

CLINICAL DATA: Postop

EXAM:
PORTABLE LEFT KNEE - 1-2 VIEW

[knee ap]
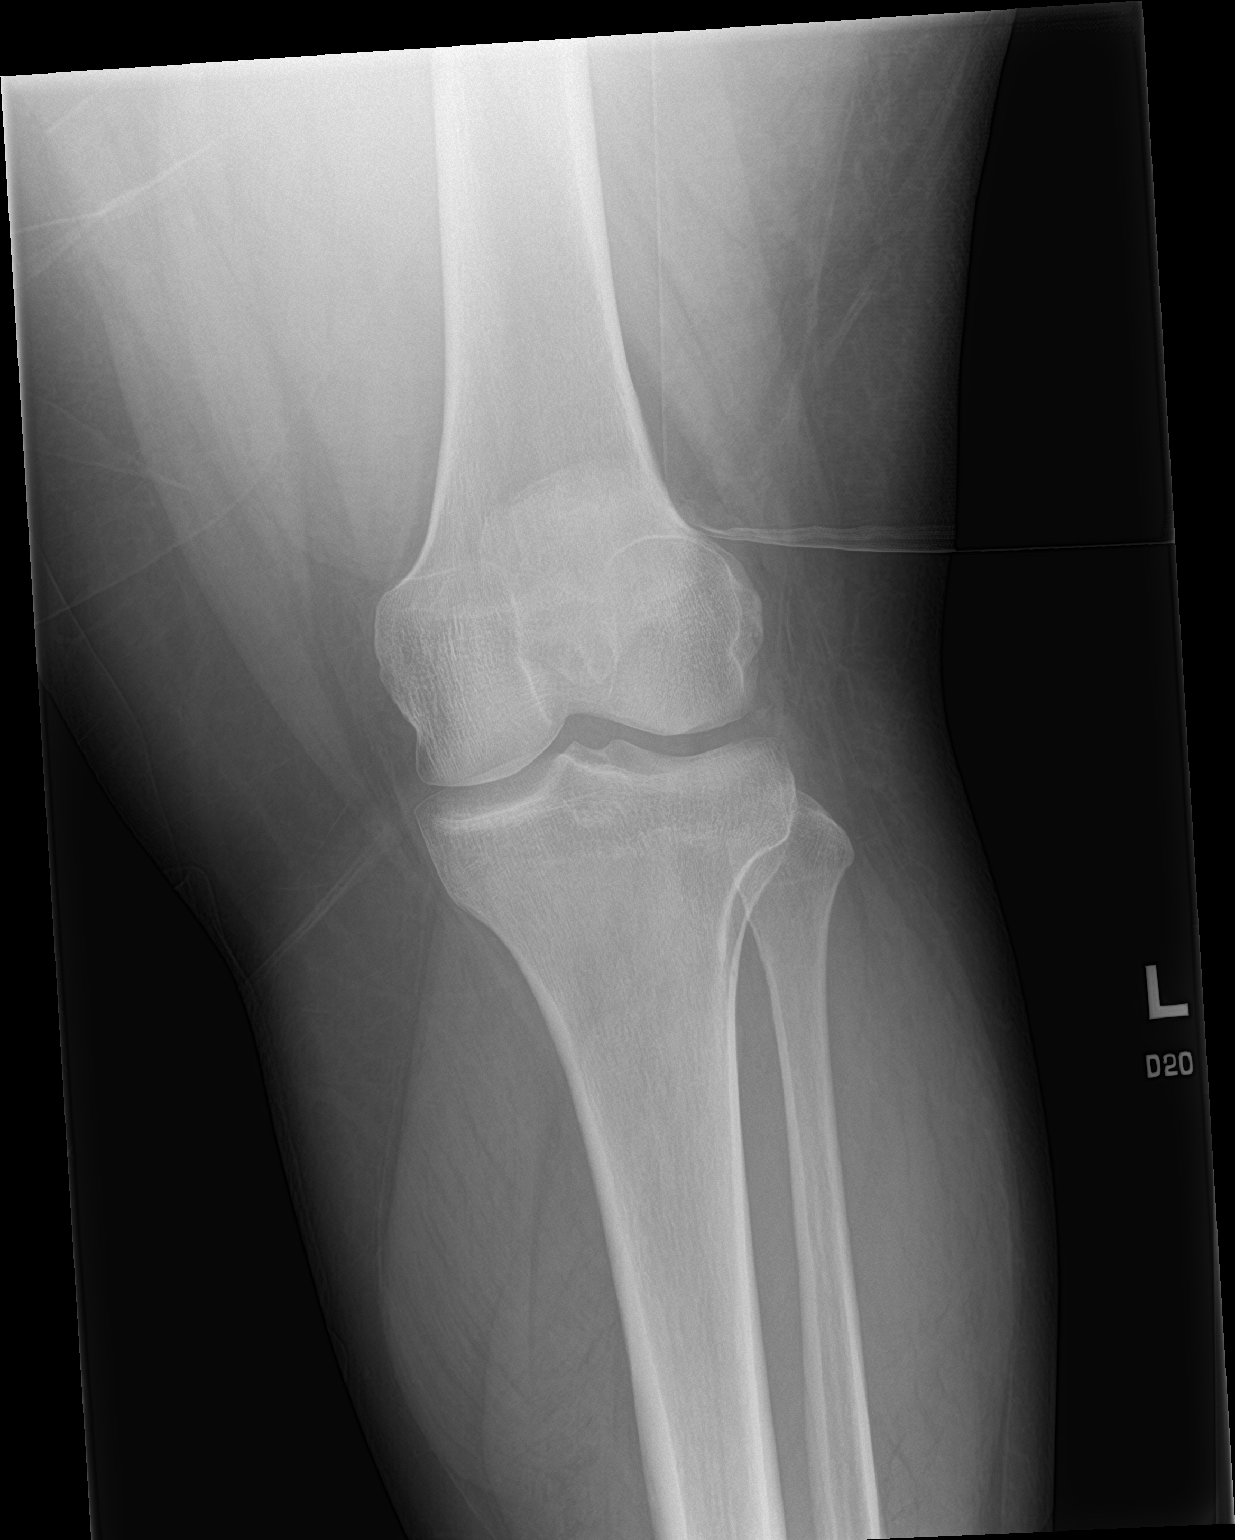

[knee lat (1 of 2)]
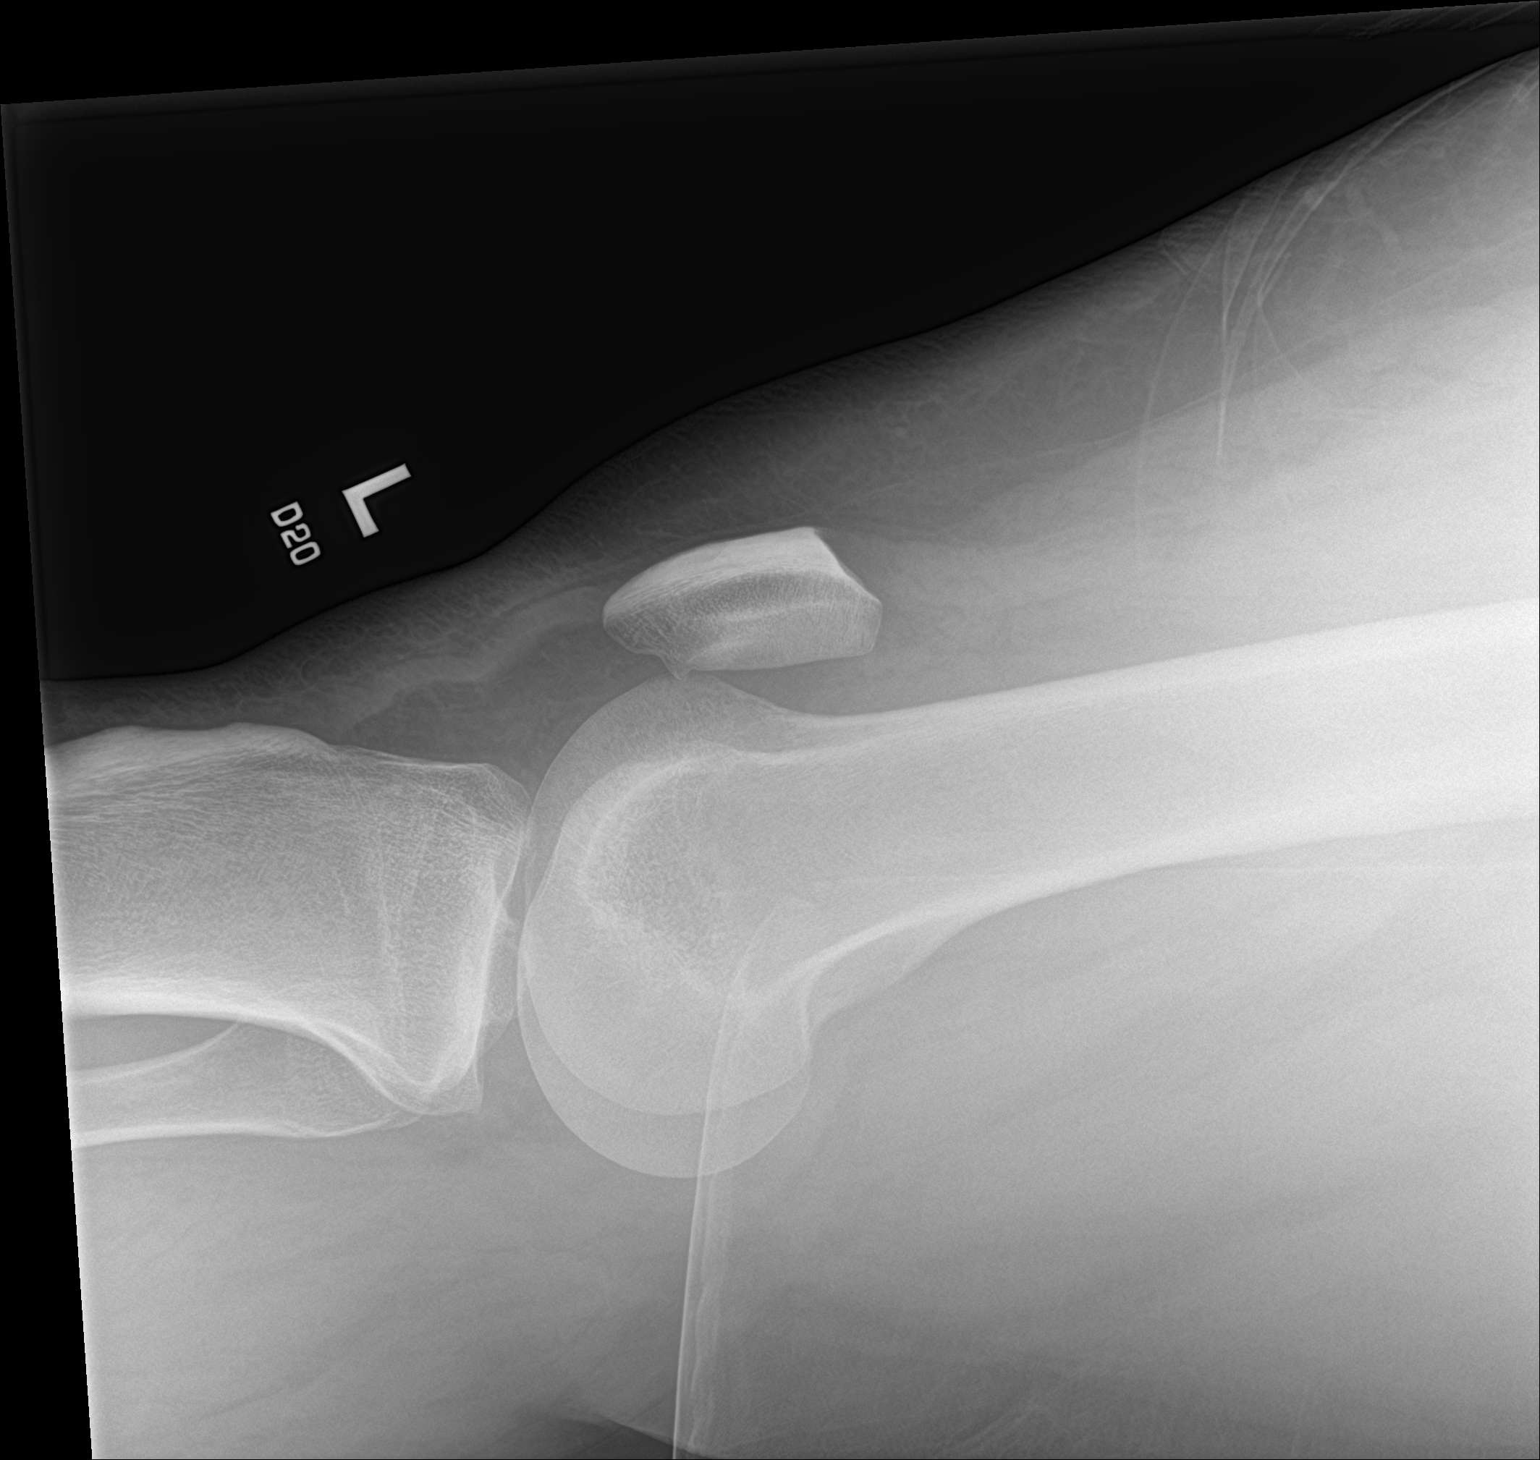

[knee lat (2 of 2)]
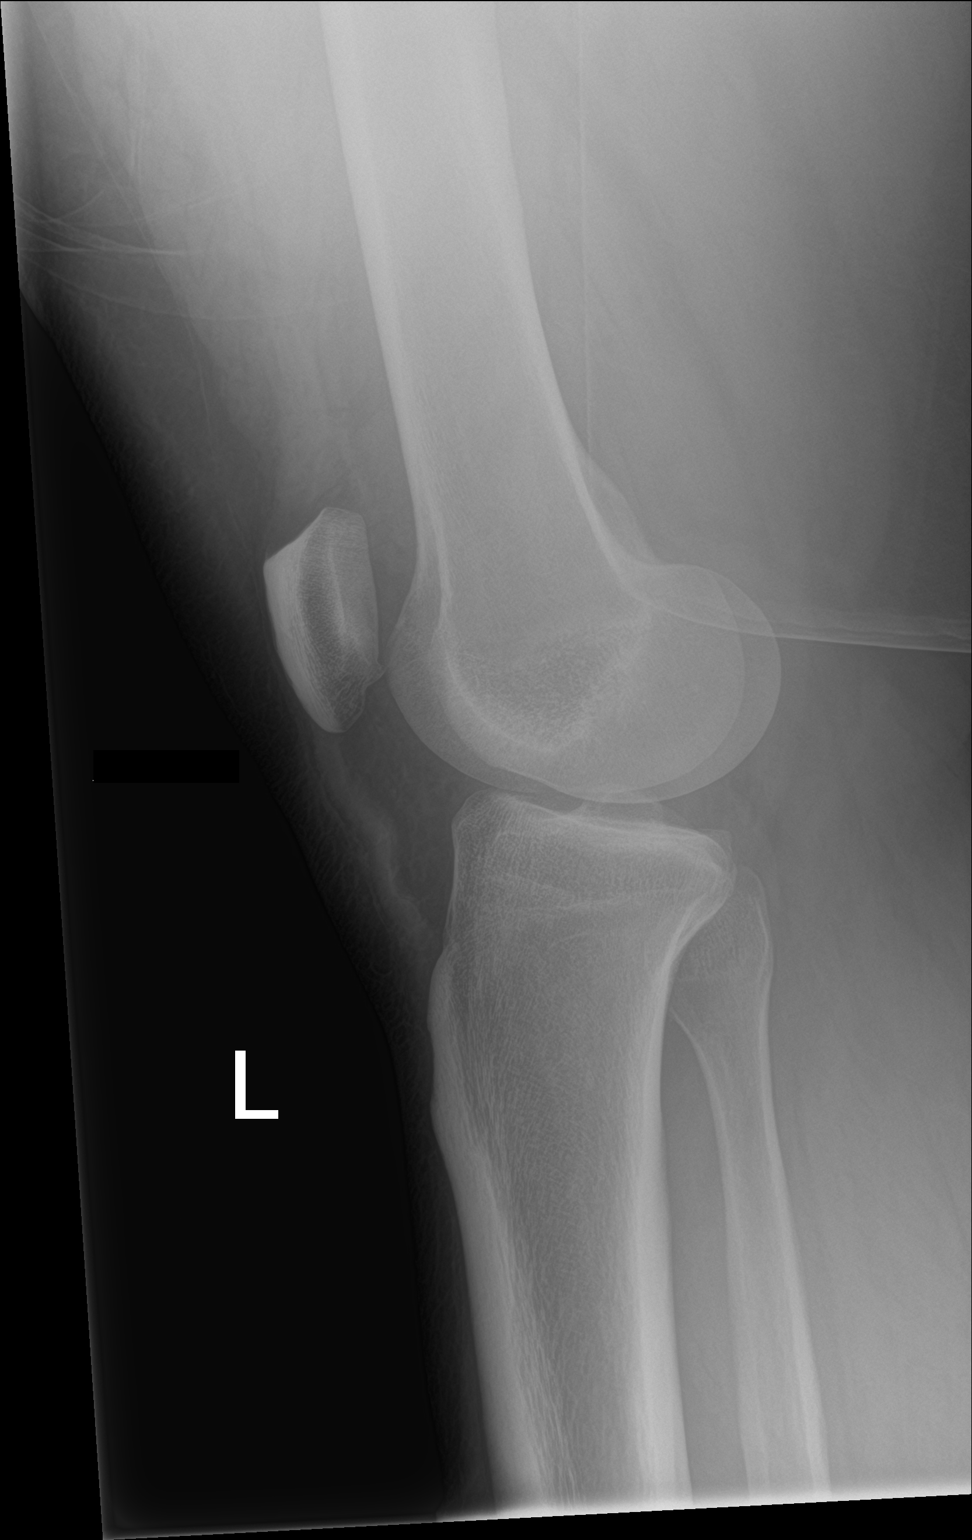

[3 of 3 positions shown; findings below may reference images not displayed]

FINDINGS: Mild laxity of the patellar tendon without significant thickening or
inflammation. No sizeable joint effusion. No acute bony abnormality.
Specifically, no fracture, subluxation, or dislocation. Some mild
patellofemoral spurring without other significant degenerative
change. Normal bone mineralization. No worrisome osseous lesions.
IMPRESSION: 1. Mild laxity of the patellar tendon without significant thickening
or inflammation possibly related to positioning.
2. No significant swelling, sizeable joint effusion or acute osseous
abnormality.
3. Minimal patellofemoral spurring.

## 2021-10-08 IMAGING — DX DG WRIST COMPLETE 3+V*R*
3 series · 3 of 3 positions shown · non-contrast
Comparison: April 01, 2020.

CLINICAL DATA: Status post open reduction internal fixation of
right wrist fracture.

EXAM:
RIGHT WRIST - COMPLETE 3+ VIEW

[wrist pa (1 of 2)]
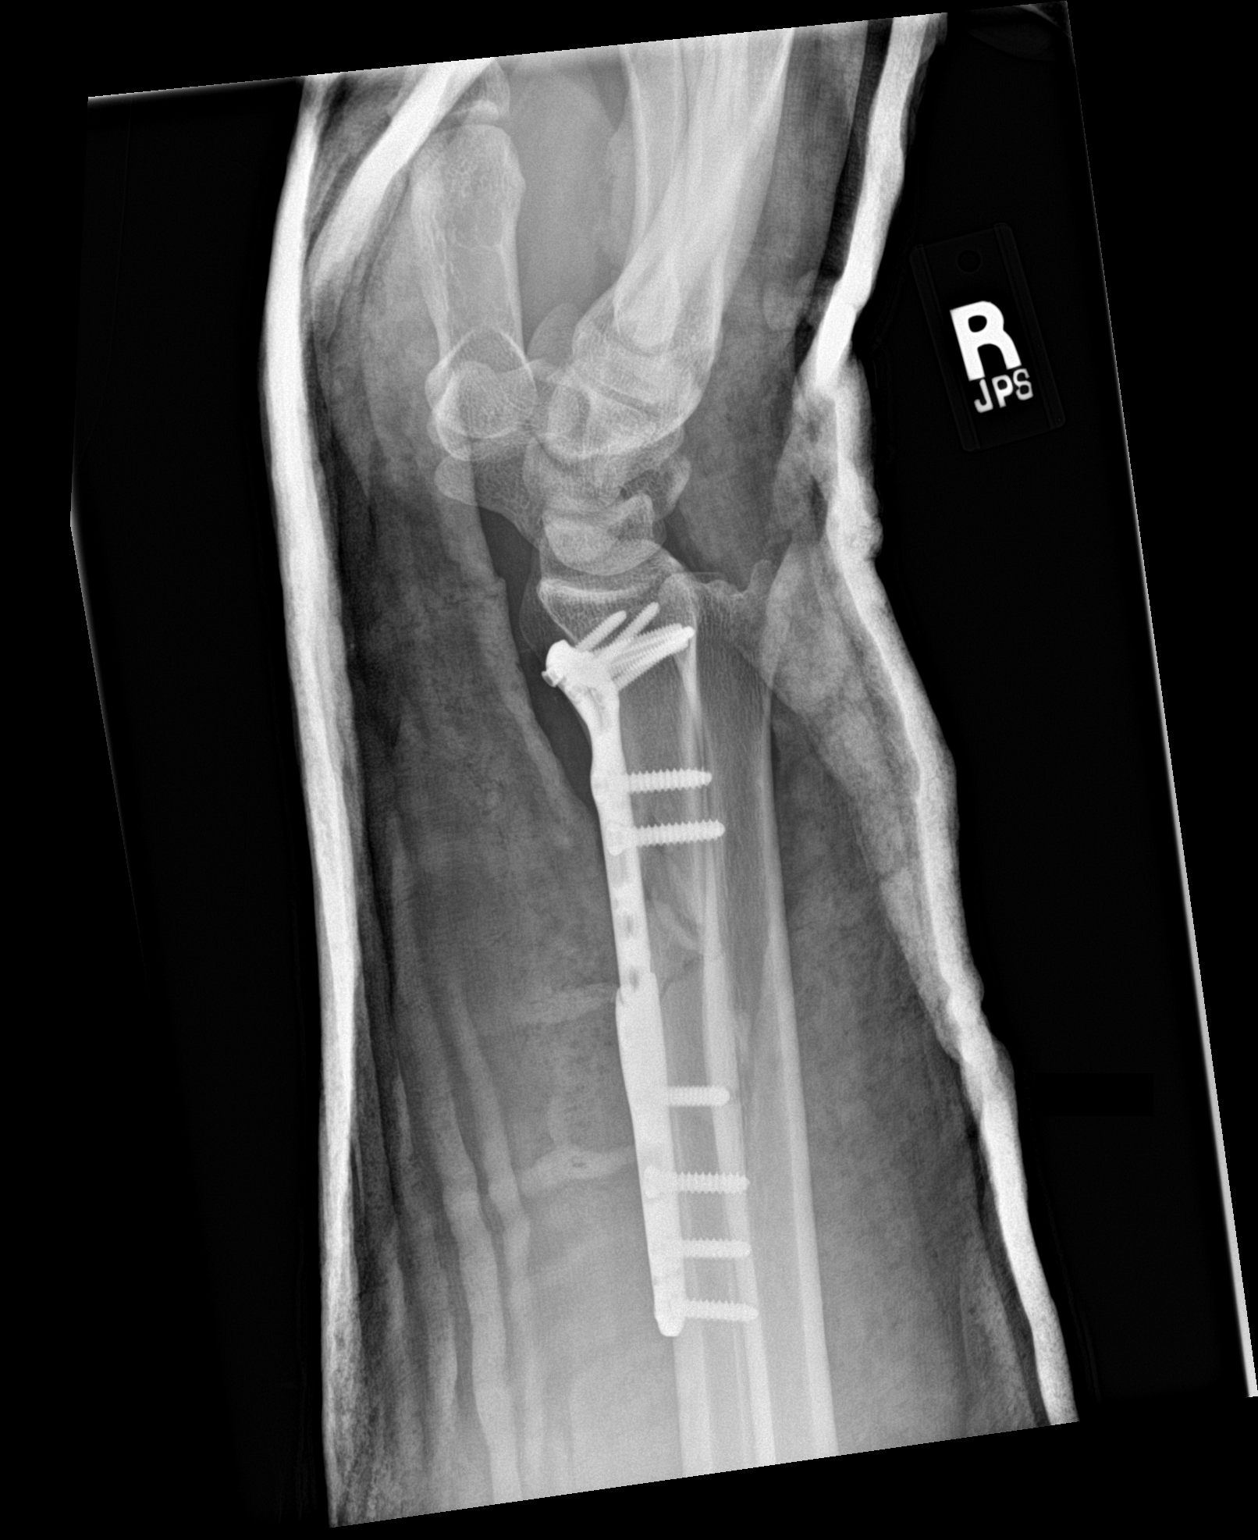

[wrist obl]
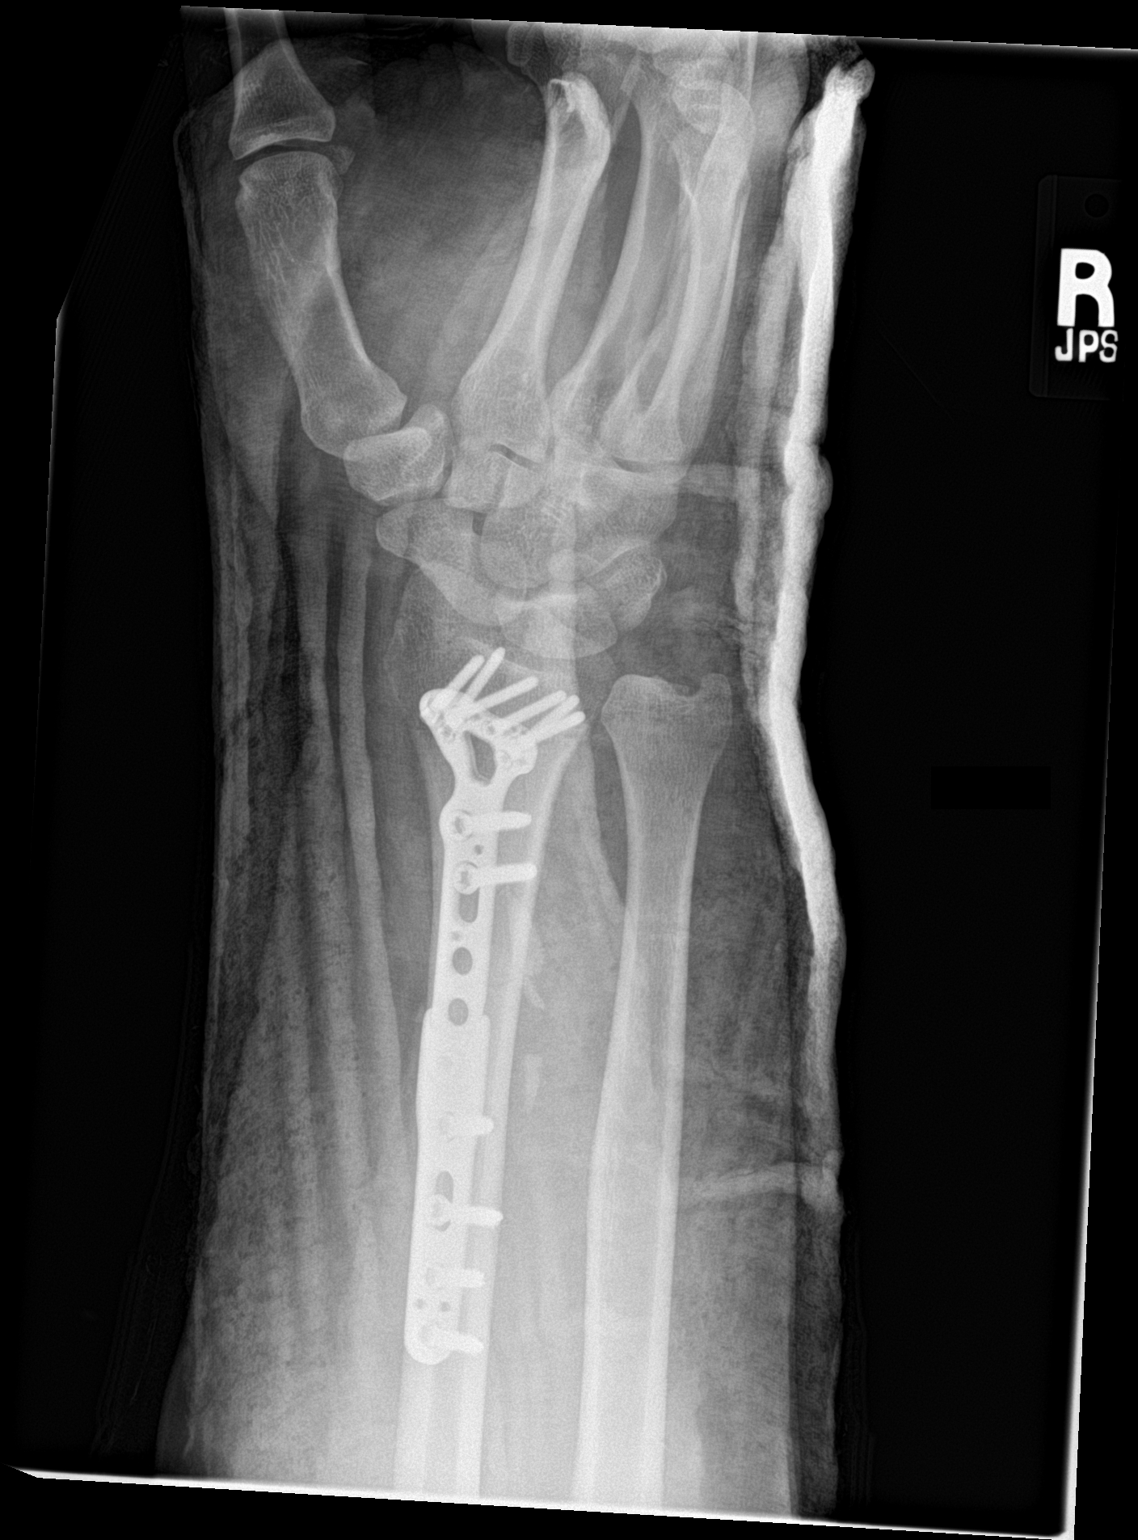

[wrist pa (2 of 2)]
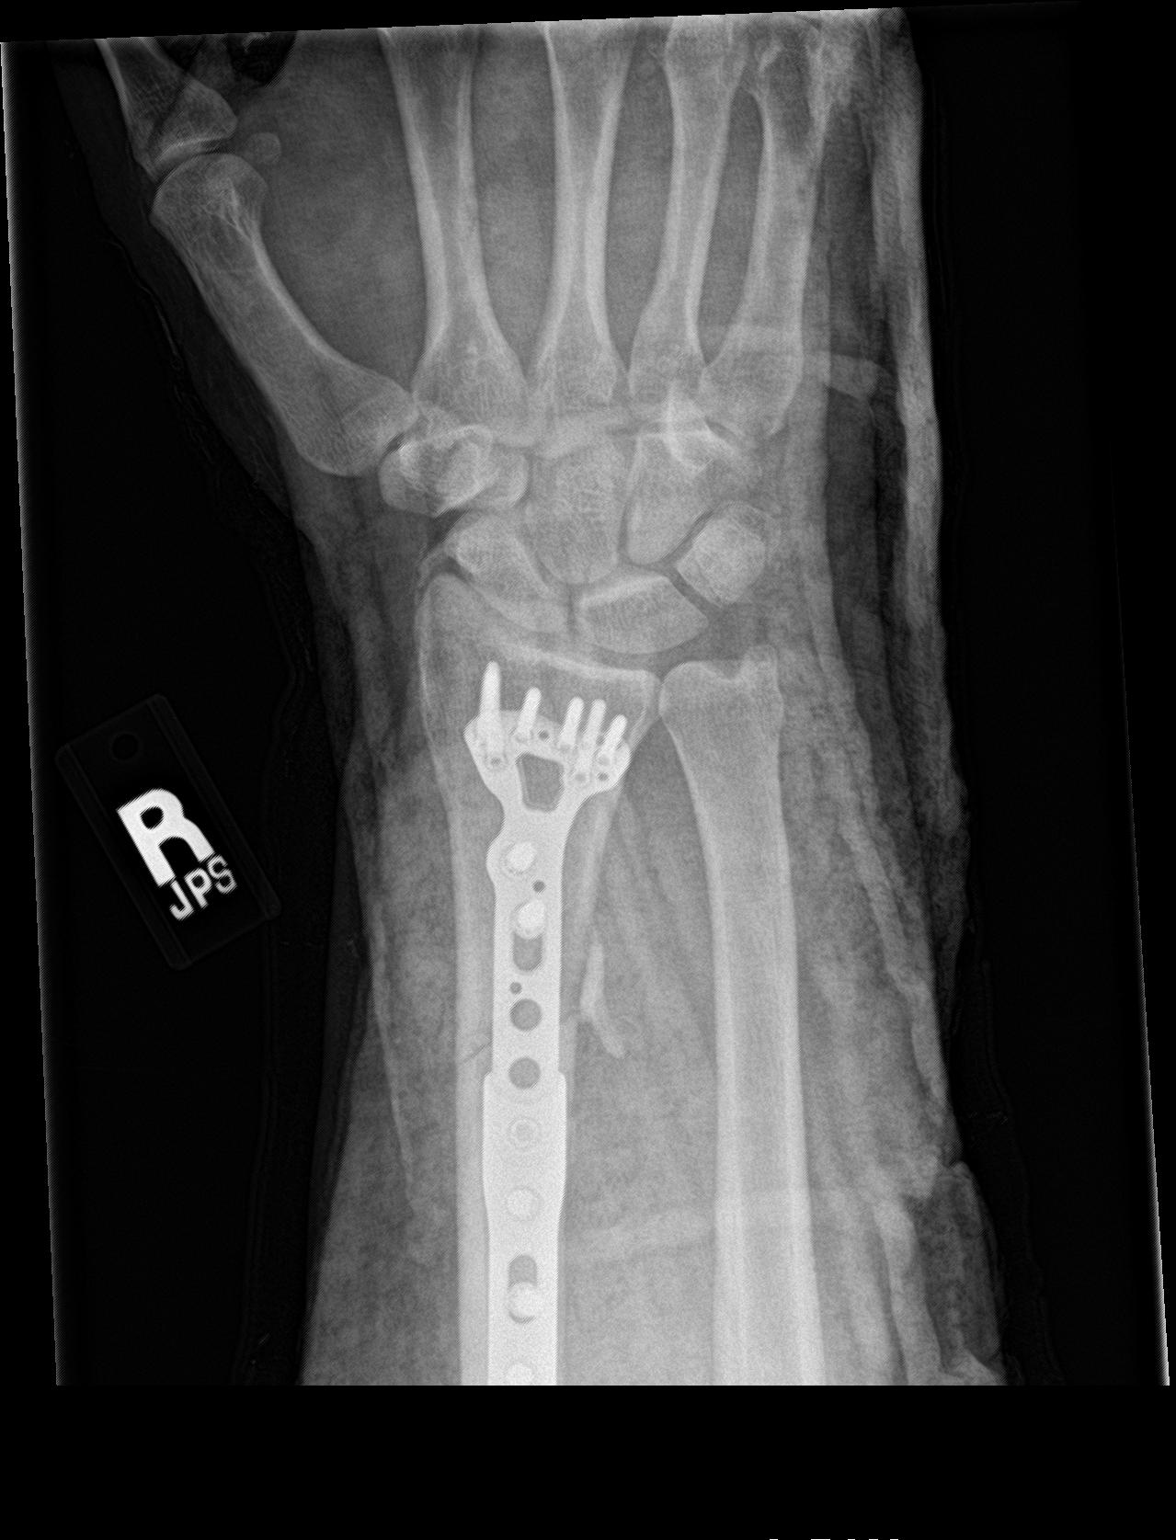

[3 of 3 positions shown; findings below may reference images not displayed]

FINDINGS: The right wrist has been casted and immobilized. There is been
surgical internal fixation of distal right radial fracture with good
alignment of fracture components.
IMPRESSION: Status post surgical internal fixation of distal right radial
fracture.

## 2021-10-08 IMAGING — DX DG PELVIS 3+V JUDET
3 series · 3 of 3 positions shown · non-contrast
Comparison: Earlier radiograph dated 04/02/2020.

CLINICAL DATA: 28-year-old male postop radiograph.

EXAM:
JUDET PELVIS - 3+ VIEW

[pelvis ap]
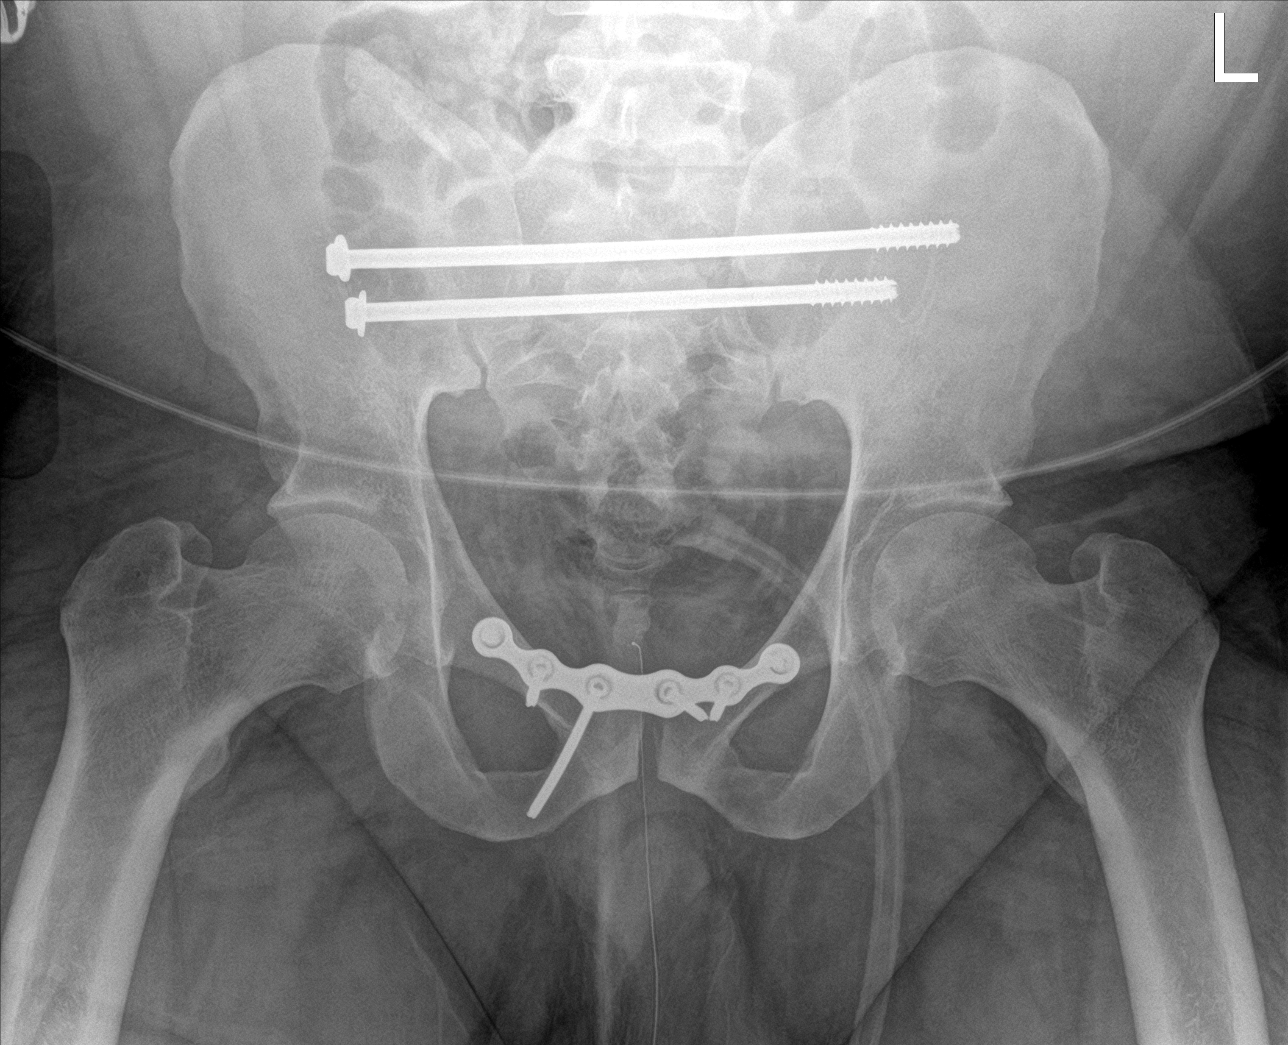

[pelvis obl (1 of 2)]
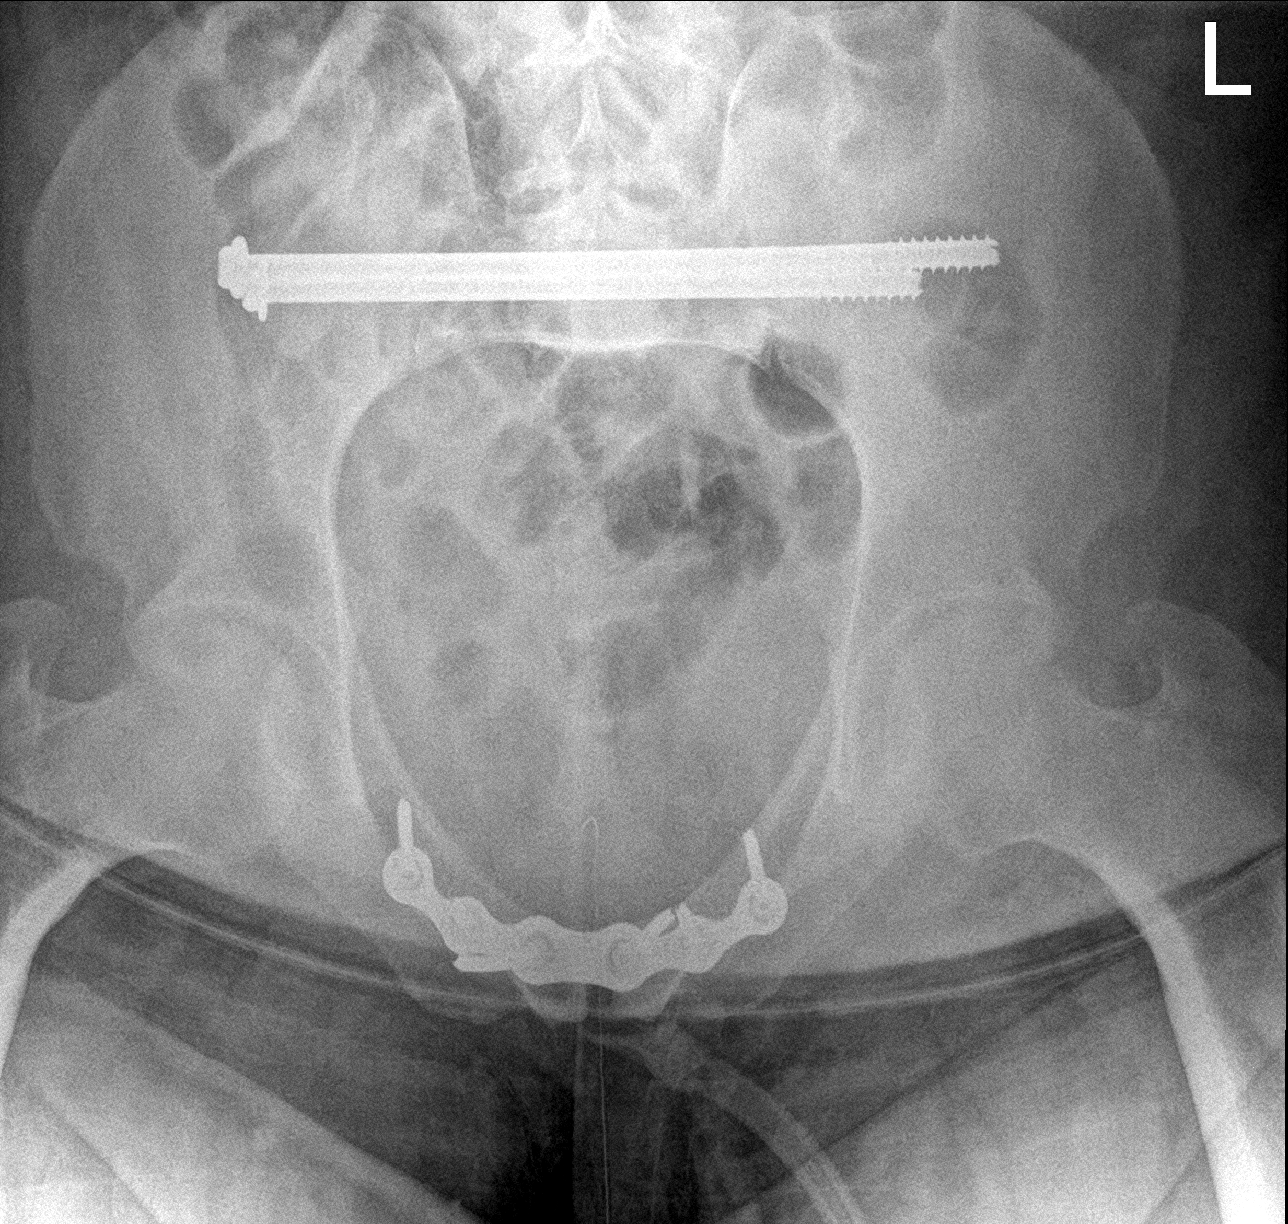

[pelvis obl (2 of 2)]
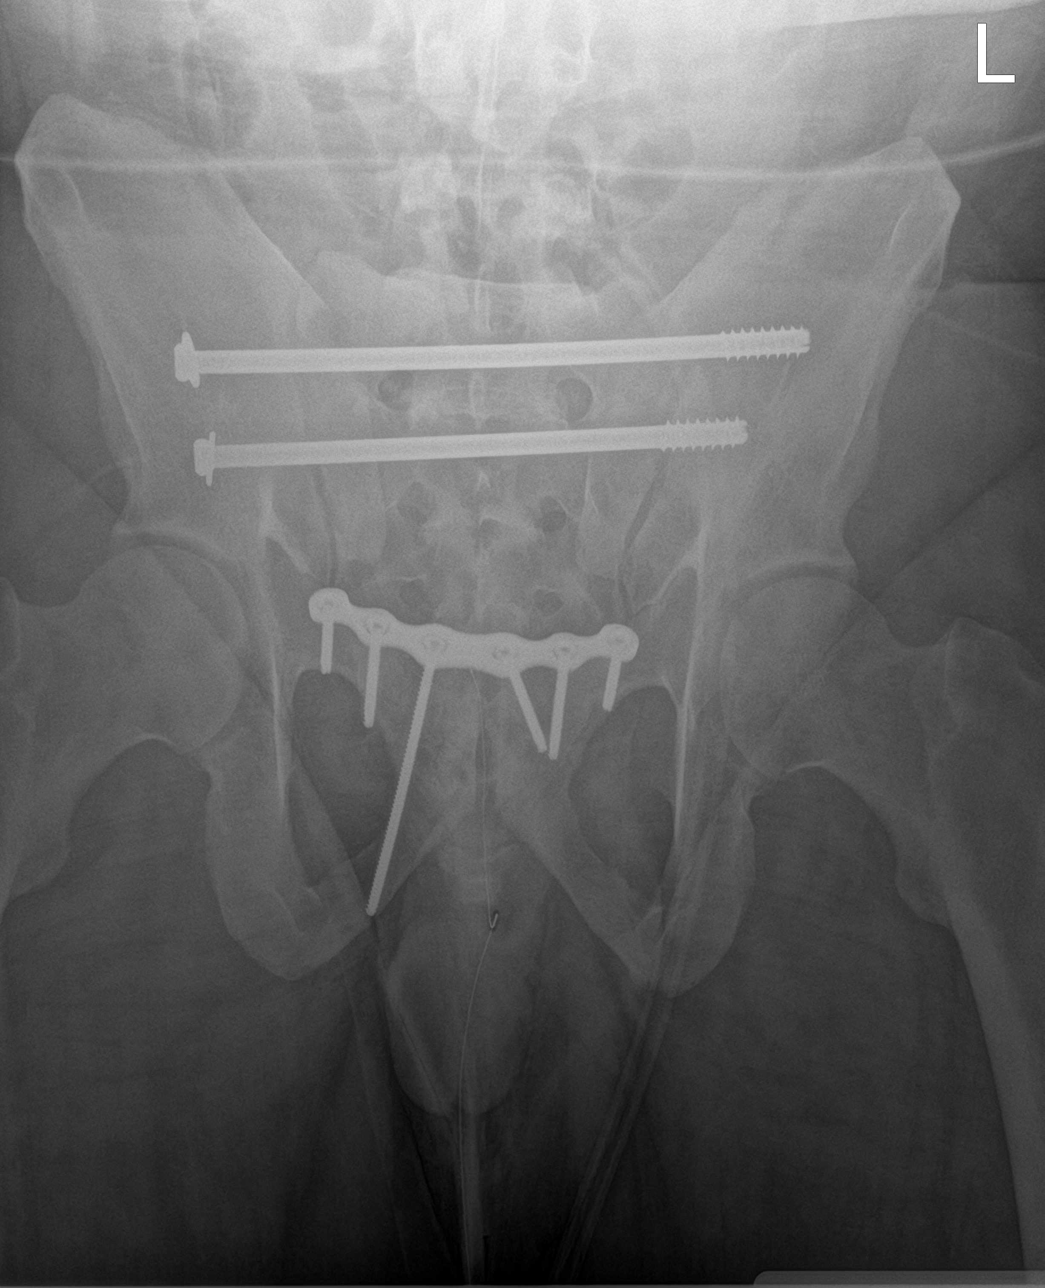

[3 of 3 positions shown; findings below may reference images not displayed]

FINDINGS: Two screws have been placed across bilateral sacroiliac joints and a
plate and screw fixation traverses the symphysis pubis. The hardware
is intact. No acute fracture identified. There is reduction of the
dislocated symphysis pubis seen on the radiograph of 04/01/2020 and
decrease in the diastasis of the SI joints. The soft tissues are
unremarkable.
IMPRESSION: Interval fixation of the symphysis pubis and bilateral sacroiliac
joints. No acute fracture.

## 2023-05-13 ENCOUNTER — Ambulatory Visit: Payer: Self-pay | Admitting: Nurse Practitioner

## 2023-05-13 ENCOUNTER — Other Ambulatory Visit (HOSPITAL_COMMUNITY)
Admission: RE | Admit: 2023-05-13 | Discharge: 2023-05-13 | Disposition: A | Discharge status: Home / Self Care | Payer: PRIVATE HEALTH INSURANCE | Source: Ambulatory Visit | Attending: Nurse Practitioner | Admitting: Nurse Practitioner

## 2023-05-13 ENCOUNTER — Encounter: Payer: Self-pay | Admitting: Nurse Practitioner

## 2023-05-13 VITALS — BP 136/86 | HR 107 | Temp 97.6°F | Ht 69.0 in | Wt >= 6400 oz

## 2023-05-13 DIAGNOSIS — Z113 Encounter for screening for infections with a predominantly sexual mode of transmission: Secondary | ICD-10-CM

## 2023-05-13 DIAGNOSIS — Z0001 Encounter for general adult medical examination with abnormal findings: Secondary | ICD-10-CM | POA: Diagnosis not present

## 2023-05-13 DIAGNOSIS — Z1159 Encounter for screening for other viral diseases: Secondary | ICD-10-CM

## 2023-05-13 DIAGNOSIS — Z6841 Body Mass Index (BMI) 40.0 and over, adult: Secondary | ICD-10-CM | POA: Diagnosis not present

## 2023-05-13 DIAGNOSIS — G478 Other sleep disorders: Secondary | ICD-10-CM | POA: Insufficient documentation

## 2023-05-13 DIAGNOSIS — R5383 Other fatigue: Secondary | ICD-10-CM

## 2023-05-13 DIAGNOSIS — Z13220 Encounter for screening for lipoid disorders: Secondary | ICD-10-CM

## 2023-05-13 DIAGNOSIS — Z0000 Encounter for general adult medical examination without abnormal findings: Secondary | ICD-10-CM

## 2023-05-13 DIAGNOSIS — R0683 Snoring: Secondary | ICD-10-CM

## 2023-05-13 LAB — TSH: TSH: 2.15 u[IU]/mL (ref 0.35–5.50)

## 2023-05-13 LAB — CBC
HCT: 41.5 % (ref 39.0–52.0)
Hemoglobin: 13.2 g/dL (ref 13.0–17.0)
MCHC: 31.7 g/dL (ref 30.0–36.0)
MCV: 82.4 fl (ref 78.0–100.0)
Platelets: 281 10*3/uL (ref 150.0–400.0)
RBC: 5.03 Mil/uL (ref 4.22–5.81)
RDW: 14.9 % (ref 11.5–15.5)
WBC: 7.5 10*3/uL (ref 4.0–10.5)

## 2023-05-13 LAB — TESTOSTERONE: Testosterone: 280.28 ng/dL — ABNORMAL LOW (ref 300.00–890.00)

## 2023-05-13 LAB — COMPREHENSIVE METABOLIC PANEL
ALT: 31 U/L (ref 0–53)
AST: 17 U/L (ref 0–37)
Albumin: 4 g/dL (ref 3.5–5.2)
Alkaline Phosphatase: 65 U/L (ref 39–117)
BUN: 13 mg/dL (ref 6–23)
CO2: 25 mEq/L (ref 19–32)
Calcium: 9.3 mg/dL (ref 8.4–10.5)
Chloride: 106 mEq/L (ref 96–112)
Creatinine, Ser: 0.9 mg/dL (ref 0.40–1.50)
GFR: 114.98 mL/min (ref >60.00)
Glucose, Bld: 103 mg/dL — ABNORMAL HIGH (ref 70–99)
Potassium: 4.2 mEq/L (ref 3.5–5.1)
Sodium: 138 mEq/L (ref 135–145)
Total Bilirubin: 0.3 mg/dL (ref 0.2–1.2)
Total Protein: 6.9 g/dL (ref 6.0–8.3)

## 2023-05-13 LAB — VITAMIN B12: Vitamin B-12: 282 pg/mL (ref 211–911)

## 2023-05-13 LAB — LIPID PANEL
Cholesterol: 180 mg/dL (ref 0–200)
HDL: 40.9 mg/dL (ref >39.00)
LDL Cholesterol: 109 mg/dL — ABNORMAL HIGH (ref 0–99)
NonHDL: 139.13
Total CHOL/HDL Ratio: 4
Triglycerides: 149 mg/dL (ref 0.0–149.0)
VLDL: 29.8 mg/dL (ref 0.0–40.0)

## 2023-05-13 LAB — HEMOGLOBIN A1C: Hgb A1c MFr Bld: 6.1 % (ref 4.6–6.5)

## 2023-05-13 LAB — VITAMIN D 25 HYDROXY (VIT D DEFICIENCY, FRACTURES): VITD: 14.51 ng/mL — ABNORMAL LOW (ref 30.00–100.00)

## 2023-05-13 NOTE — Assessment & Plan Note (Signed)
Pending TSH, A1c, lipid panel.  Work on lifestyle modifications

## 2023-05-13 NOTE — Assessment & Plan Note (Signed)
Patient endorses nonrestorative sleep and fatigue.  Patient does have 2+  tonsils along with morbid obesity will do at home sleep study to rule out sleep apnea.  Also check basic labs
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
## 2023-05-13 NOTE — Assessment & Plan Note (Signed)
Multifactorial we will do at home sleep study to rule out sleep apnea.  Also check basic labs inclusive of TSH, B12, vitamin D, testosterone.

## 2023-05-13 NOTE — Patient Instructions (Signed)
Nice to see you today I will be in touch with the labs once I have them Follow up with me in 1 year for your next physical and labs

## 2023-05-13 NOTE — Assessment & Plan Note (Signed)
Pending sleep study encourage patient to work on lifestyle modifications to reduce weight

## 2023-05-13 NOTE — Assessment & Plan Note (Signed)
Discussed age-appropriate immunizations and screening exams.  Did review patient's personal, surgical, social, family history.  Patient is up-to-date on all age-appropriate vaccinations he would like.  Patient is too young for CRC screening or prostate cancer screening.  Patient was given information at discharge about preventative healthcare maintenance with anticipatory guidance

## 2023-05-14 LAB — RPR: RPR Ser Ql: NONREACTIVE

## 2023-05-14 LAB — HIV ANTIBODY (ROUTINE TESTING W REFLEX): HIV 1&2 Ab, 4th Generation: NONREACTIVE

## 2023-05-14 LAB — HSV(HERPES SIMPLEX VRS) I + II AB-IGG
HSV 1 IGG,TYPE SPECIFIC AB: <0.9 {index}
HSV 2 IGG,TYPE SPECIFIC AB: 3.87 {index} — ABNORMAL HIGH

## 2023-05-14 LAB — HEPATITIS C ANTIBODY: Hepatitis C Ab: NONREACTIVE

## 2023-05-17 ENCOUNTER — Encounter: Payer: Self-pay | Admitting: *Deleted

## 2023-05-18 ENCOUNTER — Other Ambulatory Visit: Payer: Self-pay | Admitting: Nurse Practitioner

## 2023-05-18 DIAGNOSIS — R7989 Other specified abnormal findings of blood chemistry: Secondary | ICD-10-CM

## 2023-05-18 DIAGNOSIS — E559 Vitamin D deficiency, unspecified: Secondary | ICD-10-CM

## 2023-05-18 MED ORDER — VITAMIN D (ERGOCALCIFEROL) 1.25 MG (50000 UNIT) PO CAPS: 50000.0000 [IU] | ORAL_CAPSULE | ORAL | 0 refills | Status: DC

## 2023-05-19 LAB — URINE CYTOLOGY ANCILLARY ONLY
Chlamydia: NEGATIVE
Comment: NEGATIVE
Comment: NEGATIVE
Comment: NORMAL
Neisseria Gonorrhea: NEGATIVE
Trichomonas: NEGATIVE

## 2023-06-29 ENCOUNTER — Telehealth: Payer: Self-pay | Admitting: Nurse Practitioner

## 2023-06-29 NOTE — Telephone Encounter (Signed)
Patient is requesting a call back wen able, states he has some questions regarding recent labs and sleep study. Can be reached at [phone]

## 2023-06-29 NOTE — Telephone Encounter (Signed)
Patient called back set up lab appointment as requested. I did not see sleep study in chart. Have printed and placed in your box for review.

## 2023-06-29 NOTE — Telephone Encounter (Signed)
Left message to return call to our office.  

## 2023-06-30 ENCOUNTER — Other Ambulatory Visit (INDEPENDENT_AMBULATORY_CARE_PROVIDER_SITE_OTHER): Payer: Commercial Managed Care - PPO

## 2023-06-30 DIAGNOSIS — R7989 Other specified abnormal findings of blood chemistry: Secondary | ICD-10-CM | POA: Diagnosis not present

## 2023-06-30 LAB — TESTOSTERONE: Testosterone: 254.35 ng/dL — ABNORMAL LOW (ref 300.00–890.00)

## 2023-07-01 ENCOUNTER — Telehealth: Payer: Self-pay | Admitting: Nurse Practitioner

## 2023-07-01 ENCOUNTER — Encounter: Payer: Self-pay | Admitting: Nurse Practitioner

## 2023-07-01 DIAGNOSIS — R7989 Other specified abnormal findings of blood chemistry: Secondary | ICD-10-CM

## 2023-07-01 NOTE — Telephone Encounter (Signed)
He does not have to use the company that I chose. He can find a DME company of his choice. In order to discuss risks and benefits of TRT he will need an office visit

## 2023-07-01 NOTE — Telephone Encounter (Signed)
Got results of the home sleep study and it does show sleep apnea. I have sent orders off for them to set him up with a CPAP machine.  The orders are in the MA outgoing box to be faxed with the sleep study results

## 2023-07-01 NOTE — Telephone Encounter (Signed)
Contacted pt to inform him of CPAP options. Pt verbalized understanding.   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   Pt stated that he will call on Monday to see if he can make office visit for two weeks out after talking to his employer.

## 2023-07-01 NOTE — Telephone Encounter (Signed)
Contacted pt to inform him of home sleep test results.   Pt wants to know if he has to use the CPAP machine from this company or can he shop around for other CPAP machines with different companies.   Pt asked to have a copy of his results to pick up. Will print copy and place up front.   Pt would like PCP to explain his Testosterone levels from lab work, and has questions regarding replacement therapy.   Results have been faxed to company.

## 2023-09-19 ENCOUNTER — Telehealth: Payer: Commercial Managed Care - PPO | Admitting: Physician Assistant

## 2023-09-19 DIAGNOSIS — J028 Acute pharyngitis due to other specified organisms: Secondary | ICD-10-CM | POA: Diagnosis not present

## 2023-09-19 DIAGNOSIS — B9689 Other specified bacterial agents as the cause of diseases classified elsewhere: Secondary | ICD-10-CM | POA: Diagnosis not present

## 2023-09-19 DIAGNOSIS — B999 Unspecified infectious disease: Secondary | ICD-10-CM

## 2023-09-19 MED ORDER — AMOXICILLIN-POT CLAVULANATE 875-125 MG PO TABS: 1.0000 | ORAL_TABLET | Freq: Two times a day (BID) | ORAL | 0 refills | Status: DC

## 2023-09-19 NOTE — Patient Instructions (Signed)
 Nicholas Randall, thank you for joining Delon CHRISTELLA Dickinson, PA-C for today's virtual visit.  While this provider is not your primary care provider (PCP), if your PCP is located in our provider database this encounter information will be shared with them immediately following your visit.   A [facility] MyChart account gives you access to today's visit and all your visits, tests, and labs performed at [facility]  click here if you don't have a [facility] MyChart account or go to [email or web address]  Consent: (Patient) Josejulian Tarango provided verbal consent for this virtual visit at the beginning of the encounter.  Current Medications:  Current Outpatient Medications:    amoxicillin -clavulanate (AUGMENTIN ) 875-125 MG tablet, Take 1 tablet by mouth 2 (two) times daily., Disp: 20 tablet, Rfl: 0   Vitamin D , Ergocalciferol , (DRISDOL ) 1.25 MG (50000 UNIT) CAPS capsule, Take 1 capsule (50,000 Units total) by mouth every 7 (seven) days., Disp: 12 capsule, Rfl: 0   Medications ordered in this encounter:  Meds ordered this encounter  Medications   amoxicillin -clavulanate (AUGMENTIN ) 875-125 MG tablet    Sig: Take 1 tablet by mouth 2 (two) times daily.    Dispense:  20 tablet    Refill:  0    Supervising Provider:   LAMPTEY, PHILIP O [[id number]]     *If you need refills on other medications prior to your next appointment, please contact your pharmacy*  Follow-Up: Call back or seek an in-person evaluation if the symptoms worsen or if the condition fails to improve as anticipated.  [facility] [phone]  Other Instructions Strep Throat, Adult Strep throat is an infection in the throat that is caused by bacteria. It is common during the cold months of the year. It mostly affects children who are 18-25 years old. However, people of all ages can get it at any time of the year. This infection spreads from person to person (is contagious) through coughing, sneezing,  or having close contact. Your health care provider may use other names to describe the infection. When strep throat affects the tonsils, it is called tonsillitis. When it affects the back of the throat, it is called pharyngitis. What are the causes? This condition is caused by the Streptococcus pyogenes bacteria. What increases the risk? You are more likely to develop this condition if: You care for school-age children, or are around school-age children. Children are more likely to get strep throat and may spread it to others. You spend time in crowded places where the infection can spread easily. You have close contact with someone who has strep throat. What are the signs or symptoms? Symptoms of this condition include: Fever or chills. Redness, swelling, or pain in the tonsils or throat. Pain or difficulty when swallowing. White or yellow spots on the tonsils or throat. Tender glands in the neck and under the jaw. Bad smelling breath. Red rash all over the body. This is rare. How is this diagnosed? This condition is diagnosed by tests that check for the presence and the amount of bacteria that cause strep throat. They are: Rapid strep test. Your throat is swabbed and checked for the presence of bacteria. Results are usually ready in minutes. Throat culture test. Your throat is swabbed. The sample is placed in a cup that allows infections to grow. Results are usually ready in 1 or 2 days. How is this treated? This condition may be treated with: Medicines that kill germs (antibiotics). Medicines that relieve pain or fever. These include: Ibuprofen  or acetaminophen . Aspirin, only for people who are over the age of 92. Throat lozenges. Throat sprays. Follow these instructions at home: Medicines  Take over-the-counter and prescription medicines only as told by your health care provider. Take your antibiotic medicine as told by your health care provider. Do not stop taking the  antibiotic even if you start to feel better. Eating and drinking  If you have trouble swallowing, try eating soft foods until your sore throat feels better. Drink enough fluid to keep your urine pale yellow. To help relieve pain, you may have: Warm fluids, such as soup and tea. Cold fluids, such as frozen desserts or popsicles. General instructions Gargle with a salt-water  mixture 3-4 times a day or as needed. To make a salt-water  mixture, completely dissolve -1 tsp (3-6 g) of salt in 1 cup (237 mL) of warm water . Get plenty of rest. Stay home from work or school until you have been taking antibiotics for 24 hours. Do not use any products that contain nicotine or tobacco. These products include cigarettes, chewing tobacco, and vaping devices, such as e-cigarettes. If you need help quitting, ask your health care provider. It is up to you to get your test results. Ask your health care provider, or the department that is doing the test, when your results will be ready. Keep all follow-up visits. This is important. How is this prevented?  Do not share food, drinking cups, or personal items that could cause the infection to spread to other people. Wash your hands often with soap and water  for at least 20 seconds. If soap and water  are not available, use hand sanitizer. Make sure that all people in your house wash their hands well. Have family members tested if they have a sore throat or fever. They may need an antibiotic if they have strep throat. Contact a health care provider if: You have swelling in your neck that keeps getting bigger. You develop a rash, cough, or earache. You cough up a thick mucus that is green, yellow-brown, or bloody. You have pain or discomfort that does not get better with medicine. Your symptoms seem to be getting worse. You have a fever. Get help right away if: You have new symptoms, such as vomiting, severe headache, stiff or painful neck, chest pain, or  shortness of breath. You have severe throat pain, drooling, or changes in your voice. You have swelling of the neck, or the skin on the neck becomes red and tender. You have signs of dehydration, such as tiredness (fatigue), dry mouth, and decreased urination. You become increasingly sleepy, or you cannot wake up completely. Your joints become red or painful. These symptoms may represent a serious problem that is an emergency. Do not wait to see if the symptoms will go away. Get medical help right away. Call your local emergency services (911 in the [place] Do not drive yourself to the hospital. Summary Strep throat is an infection in the throat that is caused by the Streptococcus pyogenes bacteria. This infection is spread from person to person (is contagious) through coughing, sneezing, or having close contact. Take your medicines, including antibiotics, as told by your health care provider. Do not stop taking the antibiotic even if you start to feel better. To prevent the spread of germs, wash your hands well with soap and water . Have others do the same. Do not share food, drinking cups, or personal items. Get help right away if you have new symptoms, such as vomiting, severe headache, stiff  or painful neck, chest pain, or shortness of breath. This information is not intended to replace advice given to you by your health care provider. Make sure you discuss any questions you have with your health care provider. Document Revised: 12/23/2020 Document Reviewed: 12/23/2020 Elsevier Patient Education  2024 [employer]    If you have been instructed to have an in-person evaluation today at a local Urgent Care facility, please use the link below. It will take you to a list of all of our available [facility], including address, phone number and hours of operation. Please do not delay care.  [facility]  If you or a family member do not have a primary care provider, use  the link below to schedule a visit and establish care. When you choose a [facility] primary care physician or advanced practice provider, you gain a long-term partner in health. Find a Primary Care Provider  Learn more about [facility]'s in-office and virtual care options: [facility] - Get Care Now
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
## 2023-09-21 ENCOUNTER — Encounter: Payer: Self-pay | Admitting: Nurse Practitioner

## 2024-01-18 ENCOUNTER — Encounter: Payer: Self-pay | Admitting: Family Medicine

## 2024-01-18 ENCOUNTER — Ambulatory Visit
Admission: RE | Admit: 2024-01-18 | Discharge: 2024-01-18 | Disposition: A | Discharge status: Home / Self Care | Source: Ambulatory Visit | Attending: Family Medicine | Admitting: Family Medicine

## 2024-01-18 ENCOUNTER — Ambulatory Visit (INDEPENDENT_AMBULATORY_CARE_PROVIDER_SITE_OTHER): Admitting: Family Medicine

## 2024-01-18 VITALS — BP 141/70 | HR 84 | Temp 98.2°F | Ht 69.0 in | Wt >= 6400 oz

## 2024-01-18 DIAGNOSIS — M25561 Pain in right knee: Secondary | ICD-10-CM

## 2024-01-18 DIAGNOSIS — M25562 Pain in left knee: Secondary | ICD-10-CM | POA: Insufficient documentation
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
## 2024-01-18 NOTE — Assessment & Plan Note (Signed)
 Suspect strain from squatting at work  Overall reassuring exam  Some medial/lateral joint line tenderness   Xray : no bony abnormality   Recommend elevation  Avoid painful activities Freq cold compresses Compression sleeve while active Voltaren gel up to four times daily   Sport med input if not improved in a week

## 2024-01-18 NOTE — Patient Instructions (Signed)
 Use cold compress as often as you can  When you sit / try and elevate your leg    If you can- look for a knee compression sleeve to use when active   Get some voltaren gel 1%  and apply up to 4 times a day to calm it down  Tylenol  as needed is ok also   Xray today   We will reach out with result and plan

## 2024-02-06 ENCOUNTER — Telehealth: Admitting: Physician Assistant

## 2024-02-06 DIAGNOSIS — R3989 Other symptoms and signs involving the genitourinary system: Secondary | ICD-10-CM

## 2024-02-06 MED ORDER — SULFAMETHOXAZOLE-TRIMETHOPRIM 800-160 MG PO TABS: 1.0000 | ORAL_TABLET | Freq: Two times a day (BID) | ORAL | 0 refills | Status: AC

## 2024-02-06 NOTE — Patient Instructions (Signed)
 Nicholas Randall, thank you for joining Angelia Kelp, PA-C for today's virtual visit.  While this provider is not your primary care provider (PCP), if your PCP is located in our provider database this encounter information will be shared with them immediately following your visit.   A [facility] MyChart account gives you access to today's visit and all your visits, tests, and labs performed at [facility] " click here if you don't have a [facility] MyChart account or go to [email or web address]  Consent: (Patient) Creighton Longley provided verbal consent for this virtual visit at the beginning of the encounter.  Current Medications:  Current Outpatient Medications:    sulfamethoxazole-trimethoprim (BACTRIM DS) 800-160 MG tablet, Take 1 tablet by mouth 2 (two) times daily., Disp: 14 tablet, Rfl: 0   Medications ordered in this encounter:  Meds ordered this encounter  Medications   sulfamethoxazole-trimethoprim (BACTRIM DS) 800-160 MG tablet    Sig: Take 1 tablet by mouth 2 (two) times daily.    Dispense:  14 tablet    Refill:  0    Supervising Provider:   Corine Dice [[id number]]     *If you need refills on other medications prior to your next appointment, please contact your pharmacy*  Follow-Up: Call back or seek an in-person evaluation if the symptoms worsen or if the condition fails to improve as anticipated.  [facility] [phone]  Other Instructions Urinary Tract Infection, Male A urinary tract infection (UTI) is an infection in your urinary tract. The urinary tract is made up of the organs that make, store, and get rid of pee (urine) in your body. These organs include: The kidneys. The ureters. The bladder. The urethra. What are the causes? Most UTIs are caused by germs called bacteria. They may be in or near your genitals. These germs grow and cause swelling in your urinary tract. What increases the risk? You're more likely to  get a UTI if: You have a soft tube called a catheter that drains your pee. You can't control when you pee or poop. You have trouble peeing because of: A prostate that's bigger than normal. A kidney stone. A urinary blockage. A nerve condition that affects your bladder. Not getting enough to drink. You're sexually active. You're an older adult. You're also more likely to get a UTI if you have other health problems, such as: Diabetes. A weak immune system. Your immune system is your body's defense system. Sickle cell disease. Injury of the spine. What are the signs or symptoms? Symptoms may include: Needing to pee right away. Peeing small amounts often. Trouble getting the stream started. Pain or burning when you pee. Blood in your pee. Pee that smells bad or odd. Pain in your belly or lower back. You may also: Feel confused. This may be the first symptom in older adults. Vomit. Not feel hungry. Feel tired or easily annoyed. Have a fever or chills. How is this diagnosed? A UTI is diagnosed based on your medical history and an exam. You may also have other tests. These may include: Pee tests. Blood tests. Tests for sexually transmitted infections (STIs). If you've had more than one UTI, you may need to have imaging studies done to find out why you keep getting them. How is this treated? A UTI can be treated by: Taking antibiotics or other medicines. Drinking enough fluid to keep your pee pale yellow. In rare cases, a UTI can cause a very bad condition called sepsis. Sepsis may  be treated in the hospital. Follow these instructions at home: Medicines Take your medicines only as told by your health care provider. If you were given antibiotics, take them as told by your provider. Do not stop taking them even if you start to feel better. General instructions Make sure you: Pee often and fully. Do not hold your pee for a long time. Pee after you have sex. Contact a health  care provider if: Your symptoms don't get better after 1-2 days of taking antibiotics. Your symptoms go away and then come back. You have a fever or chills. You vomit or feel like you may vomit. Get help right away if: You have very bad pain in your back or lower belly. You faint. This information is not intended to replace advice given to you by your health care provider. Make sure you discuss any questions you have with your health care provider. Document Revised: 12/03/2022 Document Reviewed: 12/03/2022 Elsevier Patient Education  2024 Elsevier Inc.   If you have been instructed to have an in-person evaluation today at a local Urgent Care facility, please use the link below. It will take you to a list of all of our available [facility], including address, phone number and hours of operation. Please do not delay care.  [facility]  If you or a family member do not have a primary care provider, use the link below to schedule a visit and establish care. When you choose a [facility] primary care physician or advanced practice provider, you gain a long-term partner in health. Find a Primary Care Provider  Learn more about [facility]'s in-office and virtual care options: [facility] - Get Care Now
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
## 2024-04-12 ENCOUNTER — Encounter: Admitting: Nurse Practitioner

## 2024-04-12 NOTE — Progress Notes (Deleted)
   Established Patient Office Visit  Subjective   Patient ID: Edson Deridder, male    DOB: Jun 10, 1991  Age: 33 y.o. MRN: [id number]  No chief complaint on file.   HPI   for complete physical and follow up of chronic conditions.   OSA:   Immunizations: -Tetanus: Completed in 2021 -Influenza: out of season  -Shingles: too young  -Pneumonia: too young -HPV:  - Covid:   Diet: Fair diet.  Exercise: No regular exercise.  Eye exam: Completes annually  Dental exam: Completes semi-annually    Colonoscopy: Too young, currently average risk Lung Cancer Screening: Completed in   PSA: Too young, currently average risk  STI:  Sleep:     {History (Optional):23778}  ROS    Objective:     There were no vitals taken for this visit. {Vitals History (Optional):23777}  Physical Exam   No results found for any visits on 04/12/24.  {Labs (Optional):23779}  The ASCVD Risk score (Arnett DK, et al., 2019) failed to calculate for the following reasons:   The 2019 ASCVD risk score is only valid for ages 63 to 32    Assessment & Plan:   Problem List Items Addressed This Visit   None   No follow-ups on file.    Adina Crandall, NP
